# Patient Record
Sex: Female | Born: 1935 | Race: White | Hispanic: No | Smoking: Former smoker
Health system: Southern US, Community
[De-identification: ages and names within clinical notes are randomized; demographics above are authoritative.]

## PROBLEM LIST (undated history)

## (undated) DIAGNOSIS — I10 Essential (primary) hypertension: Secondary | ICD-10-CM

## (undated) DIAGNOSIS — C801 Malignant (primary) neoplasm, unspecified: Secondary | ICD-10-CM

## (undated) DIAGNOSIS — H269 Unspecified cataract: Secondary | ICD-10-CM

## (undated) DIAGNOSIS — R600 Localized edema: Secondary | ICD-10-CM

## (undated) DIAGNOSIS — E079 Disorder of thyroid, unspecified: Secondary | ICD-10-CM

## (undated) HISTORY — PX: ABDOMINAL HYSTERECTOMY: SHX81

## (undated) HISTORY — PX: APPENDECTOMY: SHX54

## (undated) HISTORY — PX: THROAT SURGERY: SHX803

## (undated) HISTORY — DX: Essential (primary) hypertension: I10

## (undated) HISTORY — PX: CHOLECYSTECTOMY: SHX55

## (undated) HISTORY — PX: CATARACT EXTRACTION: SUR2

## (undated) HISTORY — DX: Localized edema: R60.0

---

## 1997-06-04 ENCOUNTER — Other Ambulatory Visit: Admission: RE | Admit: 1997-06-04 | Discharge: 1997-06-04 | Payer: Self-pay | Admitting: Obstetrics and Gynecology

## 1998-06-25 ENCOUNTER — Other Ambulatory Visit: Admission: RE | Admit: 1998-06-25 | Discharge: 1998-06-25 | Payer: Self-pay | Admitting: Obstetrics and Gynecology

## 1999-06-26 ENCOUNTER — Other Ambulatory Visit: Admission: RE | Admit: 1999-06-26 | Discharge: 1999-06-26 | Payer: Self-pay | Admitting: Obstetrics and Gynecology

## 2001-09-20 ENCOUNTER — Encounter: Payer: Self-pay | Admitting: Otolaryngology

## 2001-09-20 ENCOUNTER — Ambulatory Visit (HOSPITAL_COMMUNITY): Admission: RE | Admit: 2001-09-20 | Discharge: 2001-09-20 | Payer: Self-pay | Admitting: Otolaryngology

## 2001-10-03 ENCOUNTER — Ambulatory Visit (HOSPITAL_BASED_OUTPATIENT_CLINIC_OR_DEPARTMENT_OTHER): Admission: RE | Admit: 2001-10-03 | Discharge: 2001-10-03 | Payer: Self-pay | Admitting: Otolaryngology

## 2001-10-03 ENCOUNTER — Encounter (INDEPENDENT_AMBULATORY_CARE_PROVIDER_SITE_OTHER): Payer: Self-pay | Admitting: Specialist

## 2001-10-20 ENCOUNTER — Ambulatory Visit (HOSPITAL_COMMUNITY): Admission: RE | Admit: 2001-10-20 | Discharge: 2001-10-20 | Payer: Self-pay | Admitting: Otolaryngology

## 2001-10-20 ENCOUNTER — Encounter (INDEPENDENT_AMBULATORY_CARE_PROVIDER_SITE_OTHER): Payer: Self-pay | Admitting: *Deleted

## 2002-06-04 ENCOUNTER — Encounter (INDEPENDENT_AMBULATORY_CARE_PROVIDER_SITE_OTHER): Payer: Self-pay | Admitting: *Deleted

## 2002-06-04 ENCOUNTER — Ambulatory Visit (HOSPITAL_BASED_OUTPATIENT_CLINIC_OR_DEPARTMENT_OTHER): Admission: RE | Admit: 2002-06-04 | Discharge: 2002-06-04 | Payer: Self-pay | Admitting: Otolaryngology

## 2003-07-25 ENCOUNTER — Ambulatory Visit (HOSPITAL_COMMUNITY): Admission: RE | Admit: 2003-07-25 | Discharge: 2003-07-25 | Payer: Self-pay | Admitting: Family Medicine

## 2003-08-22 ENCOUNTER — Ambulatory Visit (HOSPITAL_COMMUNITY): Admission: RE | Admit: 2003-08-22 | Discharge: 2003-08-22 | Payer: Self-pay | Admitting: Urology

## 2003-09-06 ENCOUNTER — Ambulatory Visit (HOSPITAL_COMMUNITY): Admission: RE | Admit: 2003-09-06 | Discharge: 2003-09-06 | Payer: Self-pay | Admitting: General Surgery

## 2003-09-11 ENCOUNTER — Observation Stay (HOSPITAL_COMMUNITY): Admission: RE | Admit: 2003-09-11 | Discharge: 2003-09-12 | Payer: Self-pay | Admitting: General Surgery

## 2003-12-02 ENCOUNTER — Ambulatory Visit (HOSPITAL_COMMUNITY): Admission: RE | Admit: 2003-12-02 | Discharge: 2003-12-02 | Payer: Self-pay | Admitting: Family Medicine

## 2004-05-07 ENCOUNTER — Ambulatory Visit (HOSPITAL_COMMUNITY): Admission: RE | Admit: 2004-05-07 | Discharge: 2004-05-07 | Payer: Self-pay | Admitting: Family Medicine

## 2004-06-05 ENCOUNTER — Ambulatory Visit: Payer: Self-pay | Admitting: Internal Medicine

## 2004-06-10 ENCOUNTER — Ambulatory Visit: Payer: Self-pay | Admitting: Internal Medicine

## 2004-06-10 ENCOUNTER — Ambulatory Visit (HOSPITAL_COMMUNITY): Admission: RE | Admit: 2004-06-10 | Discharge: 2004-06-10 | Payer: Self-pay | Admitting: Internal Medicine

## 2004-08-17 ENCOUNTER — Observation Stay (HOSPITAL_COMMUNITY): Admission: RE | Admit: 2004-08-17 | Discharge: 2004-08-18 | Payer: Self-pay | Admitting: Urology

## 2004-11-03 ENCOUNTER — Ambulatory Visit (HOSPITAL_COMMUNITY): Admission: RE | Admit: 2004-11-03 | Discharge: 2004-11-03 | Payer: Self-pay | Admitting: Family Medicine

## 2004-12-21 ENCOUNTER — Other Ambulatory Visit: Admission: RE | Admit: 2004-12-21 | Discharge: 2004-12-21 | Payer: Self-pay | Admitting: General Surgery

## 2005-02-02 ENCOUNTER — Ambulatory Visit: Payer: Self-pay | Admitting: Internal Medicine

## 2005-02-12 ENCOUNTER — Ambulatory Visit: Payer: Self-pay | Admitting: Internal Medicine

## 2005-02-12 ENCOUNTER — Ambulatory Visit (HOSPITAL_COMMUNITY): Admission: RE | Admit: 2005-02-12 | Discharge: 2005-02-12 | Payer: Self-pay | Admitting: Internal Medicine

## 2005-04-28 ENCOUNTER — Ambulatory Visit (HOSPITAL_COMMUNITY): Admission: RE | Admit: 2005-04-28 | Discharge: 2005-04-28 | Payer: Self-pay | Admitting: Urology

## 2005-09-07 ENCOUNTER — Ambulatory Visit (HOSPITAL_COMMUNITY): Admission: RE | Admit: 2005-09-07 | Discharge: 2005-09-07 | Payer: Self-pay | Admitting: Family Medicine

## 2005-11-16 ENCOUNTER — Ambulatory Visit (HOSPITAL_COMMUNITY): Admission: RE | Admit: 2005-11-16 | Discharge: 2005-11-16 | Payer: Self-pay | Admitting: Family Medicine

## 2006-03-28 ENCOUNTER — Ambulatory Visit (HOSPITAL_COMMUNITY): Admission: RE | Admit: 2006-03-28 | Discharge: 2006-03-28 | Payer: Self-pay | Admitting: Family Medicine

## 2006-05-23 ENCOUNTER — Ambulatory Visit (HOSPITAL_COMMUNITY): Admission: RE | Admit: 2006-05-23 | Discharge: 2006-05-23 | Payer: Self-pay | Admitting: Urology

## 2006-12-16 ENCOUNTER — Ambulatory Visit (HOSPITAL_COMMUNITY): Admission: RE | Admit: 2006-12-16 | Discharge: 2006-12-16 | Payer: Self-pay | Admitting: Family Medicine

## 2007-01-25 ENCOUNTER — Other Ambulatory Visit: Admission: RE | Admit: 2007-01-25 | Discharge: 2007-01-25 | Payer: Self-pay | Admitting: Obstetrics and Gynecology

## 2007-01-30 ENCOUNTER — Ambulatory Visit (HOSPITAL_COMMUNITY): Admission: RE | Admit: 2007-01-30 | Discharge: 2007-01-30 | Payer: Self-pay | Admitting: Obstetrics & Gynecology

## 2007-05-17 ENCOUNTER — Ambulatory Visit (HOSPITAL_COMMUNITY): Admission: RE | Admit: 2007-05-17 | Discharge: 2007-05-17 | Payer: Self-pay | Admitting: Family Medicine

## 2007-05-17 ENCOUNTER — Encounter: Payer: Self-pay | Admitting: Orthopedic Surgery

## 2007-06-01 ENCOUNTER — Ambulatory Visit (HOSPITAL_COMMUNITY): Admission: RE | Admit: 2007-06-01 | Discharge: 2007-06-01 | Payer: Self-pay | Admitting: Family Medicine

## 2007-06-01 ENCOUNTER — Encounter: Payer: Self-pay | Admitting: Orthopedic Surgery

## 2008-08-20 ENCOUNTER — Ambulatory Visit (HOSPITAL_COMMUNITY): Admission: RE | Admit: 2008-08-20 | Discharge: 2008-08-20 | Payer: Self-pay | Admitting: Family Medicine

## 2008-10-17 ENCOUNTER — Ambulatory Visit: Payer: Self-pay | Admitting: Orthopedic Surgery

## 2008-10-17 DIAGNOSIS — M479 Spondylosis, unspecified: Secondary | ICD-10-CM | POA: Insufficient documentation

## 2008-10-17 DIAGNOSIS — M5137 Other intervertebral disc degeneration, lumbosacral region: Secondary | ICD-10-CM | POA: Insufficient documentation

## 2008-10-17 DIAGNOSIS — M76899 Other specified enthesopathies of unspecified lower limb, excluding foot: Secondary | ICD-10-CM

## 2008-10-24 ENCOUNTER — Encounter (HOSPITAL_COMMUNITY): Admission: RE | Admit: 2008-10-24 | Discharge: 2008-11-23 | Payer: Self-pay | Admitting: Orthopedic Surgery

## 2008-11-01 ENCOUNTER — Encounter: Payer: Self-pay | Admitting: Orthopedic Surgery

## 2009-04-01 ENCOUNTER — Ambulatory Visit (HOSPITAL_COMMUNITY): Admission: RE | Admit: 2009-04-01 | Discharge: 2009-04-01 | Payer: Self-pay | Admitting: Obstetrics and Gynecology

## 2009-07-17 ENCOUNTER — Ambulatory Visit (HOSPITAL_COMMUNITY): Admission: RE | Admit: 2009-07-17 | Discharge: 2009-07-17 | Payer: Self-pay | Admitting: Family Medicine

## 2009-12-05 DIAGNOSIS — Z8719 Personal history of other diseases of the digestive system: Secondary | ICD-10-CM

## 2009-12-09 ENCOUNTER — Encounter: Payer: Self-pay | Admitting: Internal Medicine

## 2009-12-09 ENCOUNTER — Ambulatory Visit: Payer: Self-pay | Admitting: Gastroenterology

## 2009-12-09 DIAGNOSIS — R1319 Other dysphagia: Secondary | ICD-10-CM

## 2009-12-30 ENCOUNTER — Ambulatory Visit (HOSPITAL_COMMUNITY): Admission: RE | Admit: 2009-12-30 | Discharge: 2009-12-30 | Payer: Self-pay | Admitting: Internal Medicine

## 2009-12-30 ENCOUNTER — Ambulatory Visit: Payer: Self-pay | Admitting: Internal Medicine

## 2010-01-04 ENCOUNTER — Encounter: Payer: Self-pay | Admitting: Internal Medicine

## 2010-03-18 ENCOUNTER — Encounter (INDEPENDENT_AMBULATORY_CARE_PROVIDER_SITE_OTHER): Payer: Self-pay | Admitting: *Deleted

## 2010-03-18 ENCOUNTER — Ambulatory Visit (HOSPITAL_COMMUNITY)
Admission: RE | Admit: 2010-03-18 | Discharge: 2010-03-18 | Payer: Self-pay | Source: Home / Self Care | Attending: Obstetrics & Gynecology | Admitting: Obstetrics & Gynecology

## 2010-04-09 NOTE — Assessment & Plan Note (Signed)
Summary: DIFFICULTY SWALLOWING FOODS/SS   Visit Type:  Initial Consult Primary Care Provider:  Dr. Regino Schultze  Chief Complaint:  difficulty getting food to go down.  History of Present Illness: 76 y/o caucasian female  with dysphagia on one episode w/ corn bread about 3 weeks ago.  Denies any further problems with dysphagia, including liquids and solids.  Occ TUMS w/ spicy foods 1-2 per week.  Denies regurg or anorexia.  Wt stable.  Denies rectaql bleeding or melena.  Hx hemorrhoids and some constipation. Takes stool softeners as needed.   Current Problems (verified): 1)  Gastroesophageal Reflux Disease, Hx of  (ICD-V12.79) 2)  Degenerative Disc Disease, Lumbar Spine  (ICD-722.52) 3)  Spondylosis  (ICD-721.90) 4)  Bursitis, Hip  (ICD-726.5)  Current Medications (verified): 1)  Levothroid 50 Mcg Tabs (Levothyroxine Sodium) 2)  Vit C .... Once Daily 3)  Vit.d .... Once Daily 4)  Vit A .... Once Daily 5)  Vit D .... Once Daily 6)  Xanax 0.5 Mg Tabs (Alprazolam) .... As Needed At Bedtime  Allergies (verified): 1)  ! Pcn  Past History:  Past Medical History: GERD last EGD 06/2004 Dr Pauline Good erosions Last colonscopy 02/2005 Dr Ronni Rumble AVM non-bleeding cecum, internal hemorrhoids throat CA (Dr Pollyann Kennedy) arthritis DEGENERATIVE DISC DISEASE, LUMBAR SPINE (ICD-722.52) SPONDYLOSIS (ICD-721.90) BURSITIS, HIP (ICD-726.5)  Past Surgical History: gallbladder 2005 appendix  hysterectomy Bladder surgery throat CA s/p ENT resection  Family History:  Hx, family, asthma Father: (deceased 59) bladder CA Mother: (deceased 38) CHF Siblings: 3 living, 1 brother CA lung-deceased, MI CAD 1 sister deceased CHF, cardiomyopathy  Social History: Patient is married x 10yrs  homemaker 3 children-healthy quit smoking couple weeks ago, 60 PKYR hx Alcohol Use - couple/month Illicit Drug Use - no Patient gets regular exercise. Drug Use:  no Does Patient Exercise:  yes  Vital  Signs:  Patient profile:   76 year old female Height:      68 inches Weight:      183.5 pounds BMI:     28.00 Temp:     97.8 degrees F oral Pulse rate:   68 / minute BP sitting:   130 / 88  (left arm) Cuff size:   regular  Vitals Entered By: Hendricks Limes LPN (December 09, 2009 10:57 AM)  Physical Exam  General:  Well developed, well nourished, no acute distress. Head:  Normocephalic and atraumatic. Eyes:  Sclera clear, no icterus. Ears:  Normal auditory acuity. Nose:  No deformity, discharge,  or lesions. Mouth:  No deformity or lesions.  Neck:  Supple; no masses or thyromegaly. Lungs:  Clear throughout to auscultation. Heart:  Regular rate and rhythm; no murmurs, rubs,  or bruits. Abdomen:  Soft, nontender and nondistended. No masses, hepatosplenomegaly or hernias noted. Normal bowel sounds.without guarding and without rebound.   Msk:  Symmetrical with no gross deformities. Normal posture. Pulses:  Normal pulses noted. Extremities:  No clubbing, cyanosis, edema or deformities noted. Neurologic:  Alert and  oriented x4;  grossly normal neurologically. Skin:  Intact without significant lesions or rashes. Cervical Nodes:  No significant cervical adenopathy. Psych:  Alert and cooperative. Normal mood and affect.  Impression & Recommendations:  Problem # 1:  OTHER DYSPHAGIA (ZOX-096.04) 76 y/o caucasian female w/ esophageal dysphagia w/ solid food.  I suspect esophageal, web, ring or stricture, but we need to r/o mass.  EGD with possible esophageal dilation to be performed by Dr. Jonathon Bellows in the near future.  I have discussed risks  and benefits which include, but are not limited to, bleeding, infection, perforation, or medication reaction.  The patient agrees with this plan and consent will be obtained.  Orders: Consultation Level III (82956)  Problem # 2:  GASTROESOPHAGEAL REFLUX DISEASE, HX OF (ICD-V12.79) Will add PPI  Orders: Consultation Level III  (21308)  Patient Instructions: 1)  Begin Nexium 40 mg daily for acid reflux #4 boxes samples given 2)  GERD precautions 3)  CC:  Cyril Mourning, NP

## 2010-04-09 NOTE — Letter (Signed)
Summary: EGD/ED ORDER  EGD/ED ORDER   Imported By: Ave Filter 12/09/2009 11:40:00  _____________________________________________________________________  External Attachment:    Type:   Image     Comment:   External Document

## 2010-04-09 NOTE — Letter (Signed)
Summary: Recall Office Visit  [facility]  [address]   [place], [place] [address]   Phone: [phone]  Fax: [phone]      March 18, 2010   [facility] [place] [address] [place], [place]  [address] 03/24/1934   Dear Ms. Butzer,   According to our records, it is time for you to schedule a follow-up office visit with [place].   At your convenience, please call [phone] to schedule an office visit. If you have any questions, concerns, or feel that this letter is in error, we would appreciate your call.   Sincerely,    Diana Eves  [facility] Ph: [phone]   Fax: [phone]

## 2010-04-09 NOTE — Letter (Signed)
Summary: Patient Notice, Endo Biopsy Results  [facility]  [address]   [place], [place] [address]   Phone: [phone]  Fax: [phone]       January 04, 2010   [facility] [place] [address] [place], [place]  [address] 10-17-1934    Dear Ms. Dolle,  I am pleased to inform you that the biopsies taken during your recent endoscopic examination did not show any evidence of cancer upon pathologic examination.  There was only mild inflammation.  Additional information/recommendations:  Please call [phone] to schedule a return visit to review your condition.  Continue with the treatment plan as outlined on the day of your exam.  Please call us if you are having persistent problems or have questions about your condition that have not been fully answered at this time.  Sincerely,    R. Roetta Sessions MD, FACP [facility] Ph: [phone]   Fax: [phone]   Appended Document: Patient Notice, Endo Biopsy Results letter mailed to pt  Appended Document: Patient Notice, Endo Biopsy Results reminder in computer

## 2010-04-09 NOTE — Miscellaneous (Signed)
Summary: PT order  PT order   Imported By: Cammie Sickle 06/30/2009 18:05:23  _____________________________________________________________________  External Attachment:    Type:   Image     Comment:   External Document

## 2010-04-11 ENCOUNTER — Encounter: Payer: Self-pay | Admitting: Obstetrics & Gynecology

## 2010-07-24 NOTE — Consult Note (Signed)
[name], Williamson                ACCOUNT NO.:  [id number]   MEDICAL RECORD NO.:  [id number]          PATIENT TYPE:  AMB   LOCATION:                                 FACILITY:   PHYSICIAN:  R. Roetta Sessions, M.D. DATE OF BIRTH:  08/23/1934   DATE OF CONSULTATION:  06/05/2004  DATE OF DISCHARGE:                                   CONSULTATION   REQUESTING PHYSICIAN:  Patrica Duel, M.D.   REASON FOR CONSULTATION:  Abdominal pain.   HISTORY OF PRESENT ILLNESS:  Ms. Manz is a 76 year old lady who presents  today for further evaluation of abdominal pain at the request of Dr. Patrica Duel. She say Dr. Nobie Putnam about three to four weeks ago with severe  epigastric pain and pain in right shoulder blade. She denies any associated  nausea or vomiting. She states she was quite tender on examination. She had  an abdominal ultrasound which revealed fatty liver and pancreas but no acute  abnormality. Appointment was made with [place] for further evaluation. She states  the pain in her back persists. It is constant. It seems to be worse with  movement. She does a lot of lifting, doing greenhouse work. She also  continues to have pain in the epigastrium, but this pain seems to be more  related to meals. She ate cabbage and pinto beans and sausage yesterday and  had severe pain. She does have pain intermittently related to most meals.  Denies any typical heartburn symptoms. This pain she actually has had off  and on since her gallbladder out about a year ago. She had a large solitary  2.1 cm non obstructing gallstone based on ultrasound. She denies any nausea  and vomiting. Her bowels are regular. Denies melena or rectal bleeding.  Denies dysphagia, odynophagia, or heartburn. She does take aspirin products  fairly regularly for headaches and arthritis. She takes Excedrin or Goody's  Powders. Denies any weight loss. She complains of easy bruising.   MEDICATIONS:  1.  Premarin 0.625 mg q.d.  2.   Xanax 0.5 mg q.h.s.  3.  Goody's Powder p.r.n., usually several days a week.  4.  Vitamin E p.r.n.  5.  Vitamin C p.r.n.  6.  Excedrin p.r.n., usually several days a week.   ALLERGIES:  PENICILLIN.   PAST MEDICAL HISTORY:  History of throat cancer status post resection about  three years ago. She did not have any chemotherapy or radiation therapy. She  also has headaches and arthritis.   PAST SURGICAL HISTORY:  Cholecystectomy about a year ago as outlined above.  She has had an appendectomy and resection of throat cancer tumor.   FAMILY HISTORY:  Mother died of heart failure age 95. Father died of bladder  cancer, age 63. She had a brother with MI and one with COPD. No family  history of colorectal cancer.   SOCIAL HISTORY:  She is married and has three children. She runs a  greenhouse. She smokes a pack of cigarettes daily and has smoked all of her  life. Denies any alcohol use.   REVIEW  OF SYSTEMS:  See HPI for GI. CONSTITUTIONAL:  Denies any weight loss  or fatigue. CARDIOPULMONARY:  Denies any shortness of breath or cough or  chest pain. GENITOURINARY:  Complains of urinary incontinence, followed by  Dr. Rito Ehrlich.   PHYSICAL EXAMINATION:  VITAL SIGNS:  Weight 180, temperature 97.8, blood  pressure 130/80, pulse 80.  GENERAL:  Pleasant, well-developed, well-nourished, elderly, Caucasian  female in no acute distress.  SKIN:  Warm and dry. No jaundice.  HEENT:  Pupils are equal, round, and reactive to light. Conjunctivae are  pink. Sclerae are nonicteric. Oropharyngeal mucosa moist and pink. No  lesions, erythema or exudate. No lymphadenopathy or thyromegaly.  CHEST:  Lungs are clear to auscultation. Cardiac exam reveals regular rate  and rhythm. Normal S1 and S2. No murmurs, rubs, or gallops.  ABDOMEN:  Positive bowel sounds. Soft, nondistended. She has mild epigastric  tenderness to deep palpation. No organomegaly or masses appreciated. No  rebound tenderness or guarding.  No abdominal bruits. No CVA tenderness.  Palpation along the thoracic and lumbar spine is unremarkable. There is no  rash seen.  EXTREMITIES:  No edema.   STUDIES:  She had a CT without contrast in June _________ which revealed a  gallstone but otherwise unremarkable. She had labs in January 2006 revealing  normal CBC, MET7, LFTs, TSH.   IMPRESSION:  Ms. Linda Williamson is a 76 year old lady who presents with chronic  epigastric pain with exacerbation over the last several weeks. She also has  pain in the right shoulder blade region which I feel is unrelated, and that  pain is most likely musculoskeletal in origin. With regards to her  epigastric pain, it tends to be worse with meals. She does take regular  aspirin products and is at risk for peptic ulcer disease or gastritis. We  discussed today possibility of upper GI series versus EGD, and she is  interested in upper endoscopy. She has a history of throat cancer but does  not appear to have any problems suggestive of recurrence. She complains of  easy bruising which most likely is due to her aspirin consumption; however,  will check coags. She has never had a colonoscopy, and I discussed with her  today the need for colorectal cancer screening; however, she would like to  postpone for now.   PLAN:  1.  EGD in the near future.  2.  Aciphex 20 mg daily, #4 boxes of samples.  3.  PT, PTT, LFTs and lipase.  4.  Recommend colonoscopy in some point in the near future. The patient      would like to postpone for now.  5.  I would like to thank Dr. Patrica Duel for allowing [place] to take part in      the care of this patient.      LL/MEDQ  D:  06/05/2004  T:  06/05/2004  Job:  [id number]   cc:   Patrica Duel, M.D.  [address]  [place]  [place] [address]  Fax: [phone]

## 2010-07-24 NOTE — Op Note (Signed)
Linda Williamson, ROG                ACCOUNT NO.:  [id number]   MEDICAL RECORD NO.:  [id number]          PATIENT TYPE:  OBV   LOCATION:  A336                          FACILITY:  [facility]   PHYSICIAN:  Dennie Maizes, M.D.   DATE OF BIRTH:  06/15/1934   DATE OF PROCEDURE:  08/17/2004  DATE OF DISCHARGE:  08/18/2004                                 OPERATIVE REPORT   PREOPERATIVE DIAGNOSIS:  Stress urinary incontinence.   POSTOPERATIVE DIAGNOSIS:  Stress urinary incontinence.   OPERATIVE PROCEDURE:  Transvaginal tension free suburethral sling tape  procedure.[facility] T sling).   ANESTHESIA:  General.   SURGEON:  Dennie Maizes, M.D.   COMPLICATIONS:  Trocar entry into the bladder on the right side.   ESTIMATED BLOOD LOSS:  Minimal.   DRAINS:  16-French Foley catheter in the bladder.   INDICATIONS FOR PROCEDURE:  This 76 year old female with  troublesome stress  urinary incontinence did not respond to conservative treatment. She was  taken to the OR today for a transvaginal tape suburethral sling procedure.   DESCRIPTION OF PROCEDURE:  General anesthesia was induced and the patient  was placed on the OR table in the dorsolithotomy position. The lower abdomen  and genitalia were prepped and draped in a sterile fashion. A 16-French  Foley catheter was inserted into the bladder and clear urine was drained.   The pubic tubercles were marked on the skin. A point about 1.5 cm above and  lateral to the pubic tubercles were marked on both sites in the suprapubic  area.  About 10 cc of normal saline was injected paravesically on both sides  for hydrodissection. The midureter was then held with Allis forceps. About 5  cc of sterile saline was injected submucosally in the periureteral area. The  mid ureteral incision about 1 cm in length was then made. The submucosal  planes were created by sharp and blunt dissection. The trocar carrying the T  sling was then inserted on the right side with  digital guidance. The trocar  was inserted behind the pubic ramus to exit through the previously marked  point on the skin. The bladder neck was pushed to the opposite side during  the trocar insertion. Cystoscopy revealed a small area of trocar entry into  the bladder. The trocar was then reinserted in a slightly lteral  position.Cystoscopy confirmed a satisfactory trocar placement. A similar  procedure was done on the left side with the trocar. Cystoscopy revealed  good position of the trocar outside the bladder. The suture carrying the  Prolene mesh was then taken out of the trocar. The sutures were pulled  through the suprapubic incision bringing the Prolene mesh through the  suprapubic incisions. Then a 16-French Foley catheter was inserted into the  bladder and clear urine was drained. The tension of the Prolene mesh was  then adjusted. After this a pair of Metzenbaum scissors could be placed  between the ureter and the sling without any difficulty. Plastic sheaths  covering the sling were then removed on both sides. The ureteral incision  was then closed using 3-0 Vicryl. The  redundant mesh was excised at the  level of the subcutaneous tissues. The suprapubic incisions were then closed  using 4-0 Vicryl subcuticular stitches. Abdominal dressing was applied. The  Foley catheter was connected to the bag.  The vagina was packed with  Betadine-soaked gauze. The estimated blood loss was minimal. Sponges and  instruments were correct x 2 at the time of closure. The patient was  transferred to the PACU in a satisfactory condition.       SK/MEDQ  D:  08/17/2004  T:  08/17/2004  Job:  [id number]   cc:   Patrica Duel, M.D.  [address]  [place]  [place] [address]  Fax: [phone]

## 2010-07-24 NOTE — H&P (Signed)
NAMEHAVILAH, TOPOR                ACCOUNT NO.:  [id number]   MEDICAL RECORD NO.:  [id number]          PATIENT TYPE:  AMB   LOCATION:  DAY                           FACILITY:  [facility]   PHYSICIAN:  R. Roetta Sessions, M.D. DATE OF BIRTH:  04/18/1934   DATE OF ADMISSION:  DATE OF DISCHARGE:  LH                                HISTORY & PHYSICAL   PRIMARY CARE PHYSICIAN:  Patrica Duel, M.D.   CHIEF COMPLAINT:  Intermittent diarrhea.   HISTORY OF PRESENT ILLNESS:  Mrs. Napoleon is a 76 year old Caucasian female  who is referred at the courtesy of Dr. Malvin Johns for a change of bowel  habits.  She has been seen by him for some thrombosed hemorrhoids.  He noted  over the last several months she has had alternating diarrheal stools about  twice a day.  She has also had some small volume rectal bleeding which she  contributes to her hemorrhoids.  She denies any melena.  She notes her  diarrhea is certainly worse after certain foods like greens and onions.  She  denies any associated abdominal pain. She does report some abdominal  bloating.  She also notes easy bruising as she takes Excedrin Tension  Headaches two about 2-3 times a week.  She denies any heartburn,  indigestion, dysphagia or odynophagia, nausea or vomiting.  She denies any  recent antibiotic use, denies any foreign travel.  She did have EGD by Dr.  Jena Gauss June 10, 2004, for some epigastric pain.  She had a normal esophagus  and some antral erosions.  She was H.pylori negative.  She has never had a  screening colonoscopy.   PAST MEDICAL HISTORY:  1.  Bladder surgery for carcinoma status post resection, followed by Dr.      Pollyann Kennedy.  2.  GERD with last EGD April 2006 by Dr. Jena Gauss with antral erosions.  3.  Cholecystectomy 2005.  4.  Appendectomy.  5.  Chronic tension headaches.  6.  Arthritis.   CURRENT MEDICATIONS:  1.  Premarin 0.625 mg daily.  2.  Xanax 0.5 mg q.h.s.  3.  Vitamin E daily.  4.  Vitamin C daily.  5.  Excedrin  Tension Headache p.r.n.  6.  Fish oil daily.   ALLERGIES:  Penicillin.   FAMILY HISTORY:  Mother deceased secondary to congestive heart failure age  79, father deceased secondary to bladder cancer at age 46.  She had one  brother with CAD and MI and one with COPD.  No family history of colorectal  carcinoma, liver or chronic GI problems.   SOCIAL HISTORY:  Mrs. Urieta is married.  She has three children.  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  She smokes a pack of cigarettes a day, most of  her life.  Denies any alcohol or drug use.   REVIEW OF SYSTEMS:  CONSTITUTIONAL:  Weight stable.  She is complaining of  some significant insomnia and waking up several times throughout the night.  Denies any fever or chills.  CARDIOVASCULAR:  Denies any chest pain or  palpitations. PULMONOLOGY:  She does  have a chronic productive cough with  white sputum and long standing tobacco use history.  PSYCHOSOCIAL:  She is  complaining of her husband being sick with renal problems.  She has been  having to take care of him.  She is under a significant amount of stress at  this time.  NEUROLOGICAL:  She complains of chronic tension headaches which  she is being followed by her primary care physician for this.  GI:  See HPI.   PHYSICAL EXAMINATION:  VITAL SIGNS:  Weight 172 pounds, height 69 inches,  temperature 97.9, blood pressure 126/80, pulse 88.  GENERAL APPEARANCE:  Mrs Zaugg is a 76 year old Caucasian female who is  alert, oriented, pleasant, cooperative, no acute distress.  HEENT:  Sclerae are clear, nonicteric.  Conjunctivae are pink.  Oropharynx  pink and moist without any lesions.  NECK:  Supple without any mass or thyromegaly.  CHEST:  Heart rate regular rate and rhythm with normal S1, S2.  without any  murmurs, clicks, rubs or gallops.  LUNGS:  Decreased breath sounds bilaterally with emphysematous changes.  She  does have expiratory wheezes throughout as well.  No acute distress.  ABDOMEN:   Positive bowel sounds x4.  No bruits auscultated.  Soft,  nontender, nondistended with no palpable mass or hepatosplenomegaly.  No  rebound tenderness or guarding.  EXTREMITIES:  No edema or clubbing bilaterally.  She does have multiple  petechiae to her upper forearm as well as some ecchymotic areas of her upper  and lower extremities.   IMPRESSION:  Mrs. Calais is a 76 year old Caucasian female with intermittent  episodes of diarrhea.  I suspect most of her symptoms could be related to  irritable bowel syndrome.  She is under a significant amount of stress at  this time.  She does notice symptoms are worse with certain foods, onions  and greens as well as dairy products.  She could have an element of lactose  intolerance.  She has noted some rectal bleeding as well.  Suspect this is  due to her history of thrombosed hemorrhoids.  However, she has never had a  colonoscopy, and this needs to be done to rule out colorectal carcinoma as  well as inflammatory bowel disease, which is less likely.   PLAN:  Will schedule colonoscopy with Dr. Jena Gauss in the near future.  I have  discussed procedure including risks and benefits including but not limited  to bleeding, infection, perforation, drug reaction.  She agrees  with plan  and consent will be obtained.  She is to hold her aspirin for three days  prior to the procedure.  Would like to thank Dr. Malvin Johns for allowing [place] to  participate in the care of Mrs. Rolph.      Nicholas Lose, N.P.      Jonathon Bellows, M.D.  Electronically Signed    KC/MEDQ  D:  02/02/2005  T:  02/02/2005  Job:  [id number]   cc:   Patrica Duel, M.D.  Fax: [phone]

## 2010-07-24 NOTE — Op Note (Signed)
NAMEALIVEA, GLADSON                ACCOUNT NO.:  [id number]   MEDICAL RECORD NO.:  [id number]          PATIENT TYPE:  AMB   LOCATION:  DAY                           FACILITY:  [facility]   PHYSICIAN:  R. Roetta Sessions, M.D. DATE OF BIRTH:  November 19, 1934   DATE OF PROCEDURE:  02/12/2005  DATE OF DISCHARGE:                                 OPERATIVE REPORT   PROCEDURE:  Colonoscopy with ileoscopy.   INDICATIONS FOR PROCEDURE:  A 76 year old lady with intermittent diarrhea,  some low volume painless rectal bleeding as well. Colonoscopy is now being  done. This approach has been discussed with the patient at length. Potential  risks, benefits, and alternatives have been reviewed and questions answered.  He is agreeable. Please see documentation in the medical record.   PROCEDURE NOTE:  O2 saturation, blood pressure, pulse, and respirations were  monitored throughout the entire procedure. Conscious sedation with Versed 5  mg IV and Demerol 100 mg IV in divided doses.   FINDINGS:  Digital rectal exam revealed no abnormalities.   ENDOSCOPIC FINDINGS:  Prep was adequate.   Rectum:  Examination of the rectal mucosa including retroflexed view of the  anal verge revealed only internal hemorrhoids, a couple of anal papilla.   Colon:  Colonic mucosa was surveyed from the rectosigmoid junction through  the left, transverse, and right colon to the area of the appendiceal  orifice, ileocecal valve, and cecum. These structures were well seen and  photographed for the record. Terminal ileum was intubated to 10 cm. From  this level, the scope was slowly withdrawn, and all previously mentioned  mucosal surfaces were again seen. The colonic mucosa appeared entirely  normal except for a small AVM at the cecum not felt to be clinically  significant. Terminal ileal mucosa appeared normal. The patient tolerated  the procedure well and was reactive to endoscopy.   IMPRESSION:  Internal hemorrhoids, anal papilla.  Otherwise normal rectum.  Small arteriovenous malformation, nonbleeding, cecum, not manipulated.  Otherwise normal colonic mucosa. Normal terminal ileum. I suspect the  patient has benign intermittent anorectal bleeding and likely irritable  bowel syndrome.   RECOMMENDATIONS:  1.  Hemorrhoid literature provided to Ms. Raysor. Ten-day course of Anusol      suppository one per rectum at      bedtime.  2.  Daily fiber supplementation in the way of Metamucil or Citrucel.  3.  Followup appointment with [place] in three months.      Jonathon Bellows, M.D.  Electronically Signed     RMR/MEDQ  D:  02/12/2005  T:  02/12/2005  Job:  [id number]

## 2010-07-24 NOTE — Op Note (Signed)
Linda Williamson, Linda Williamson                ACCOUNT NO.:  [id number]   MEDICAL RECORD NO.:  [id number]          PATIENT TYPE:  AMB   LOCATION:  DAY                           FACILITY:  [facility]   PHYSICIAN:  R. Roetta Sessions, M.D. DATE OF BIRTH:  February 24, 1935   DATE OF PROCEDURE:  06/10/2004  DATE OF DISCHARGE:                                 OPERATIVE REPORT   INDICATIONS FOR PROCEDURE:  A 76 year old lady with epigastric pain.  A  multitude of laboratory studies back in January of 2006 came back negative  (please see consultation note).  Prior ultrasound demonstrated fatty liver  and pancreas, but no other abnormalities.  She was started on Aciphex 20 mg  orally daily in our office on June 05, 2004.  She took a couple of pills,  said she felt much better and then stopped taking the Aciphex.  She does  take Goody's Powders and Excedrin occasionally.  EGD is now being down.  This approach has been discussed with the patient at length.  The potential  risks, benefits and alternatives have been reviewed and questions answered.  Please see the documentation in the medical record.   PROCEDURE NOTE:  O2 saturation, blood pressure, pulse and respirations were  monitored throughout the entire procedure.  Conscious sedation with Versed 4  mg IV and Demerol 100 mg IV in divided doses.   INSTRUMENT:  Olympus video chip system.   FINDINGS:  Examination of the tubular esophagus revealed no mucosal  abnormality.  The EG junction was easily traversed.   Stomach:  The gastric cavity was emptied and insufflated well with air.  Thorough examination of the gastric mucosa, including retroflexed view of  the proximal stomach and esophagogastric junction, demonstrated some  prepyloric antral erosions.  Otherwise the gastric mucosa appeared normal.  The pylorus was patent and easily traversed.  Examination of the bulb and  second portion revealed no abnormalities.   THERAPEUTIC/DIAGNOSTIC MANEUVERS PERFORMED:   None.   The patient tolerated the procedure well and reacted at endoscopy.   IMPRESSION:  1.  Normal esophagus.  2.  Antral erosions, otherwise normal stomach and normal D1 and D2.   RECOMMENDATIONS:  1.  Helicobacter pylori serologies today.  2.  Resume Aciphex 20 mg orally daily.  3.  Office appointment with [place] in three months.  4.  She needs a screening colonoscopy.  She has declined it previously.      Will address this again with her when she returns in three months.      RMR/MEDQ  D:  06/10/2004  T:  06/10/2004  Job:  [id number]   cc:   Patrica Duel, M.D.  [address]  [place]  [place] [address]  Fax: [phone]

## 2010-07-24 NOTE — Op Note (Signed)
Linda Williamson, Linda Williamson                          ACCOUNT NO.:  [id number]   MEDICAL RECORD NO.:  [id number]                   PATIENT TYPE:  OIB   LOCATION:  NA                                   FACILITY:  [facility]   PHYSICIAN:  Jefry H. Pollyann Kennedy, M.D.                DATE OF BIRTH:  Jul 19, 1934   DATE OF PROCEDURE:  09/24/2001  DATE OF DISCHARGE:                                 OPERATIVE REPORT   PREOPERATIVE DIAGNOSIS:  Laryngeal mass.   POSTOPERATIVE DIAGNOSIS:  Laryngeal mass.   PROCEDURE:  1. Microlaryngoscopy with biopsy.  2. Esophagoscopy.   SURGEON:  Jefry H. Pollyann Kennedy, M.D.   ANESTHESIA:  General endotracheal anesthesia.   COMPLICATIONS:  None.   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  There was brachy space edema and vocal polyp on  the right vocal cord with a small nodular area of leukoplakia in the mid  portion.  The esophagus was normal.  The patient tolerated the procedure  well, was awakened, extubated, and transferred to recovery in stable  condition.   HISTORY:  A 76 year old lady with history of smoking and a several month  history of hoarseness.  Office examination revealed a laryngeal mass on the  left side and potential polyp on the right.  Risks, benefits, alternatives,  and complications of the procedure were explained to the patient.  She  seemed to understand and agreed to surgery.   DESCRIPTION OF PROCEDURE:  The patient was taken to the operating room and  placed on the operating table in a supine position.  Following induction of  general endotracheal anesthesia the table was turned 90 degrees.  The  patient was draped in a standard fashion.  A maxillary tooth guard was  used.   1. Esophagoscopy.  A rigid cervical esophagoscope was entered into the oral     cavity through the cricopharyngeus and down to the hub of the scope.  It     was slowly withdrawn using suction and rotation to examine     circumferentially the wall of the cervical esophagus.  There were no     lesions identified.  2. Microlaryngoscopy with biopsy.  A Dedo laryngoscope was entered into the     oral cavity, used to visualize the laryngeal and hypopharyngeal     structures.  The hypopharynx and the  remainder of the larynx were     unremarkable except for the above-mentioned findings.  The suspension     apparatus was connected to the laryngoscope and the microscope was     brought into view.  Careful inspection of the endolarynx revealed the  above-mentioned findings.  Biopsies were taken of the posterior aspect     which was the deepest aspect of the     neoplasm.  This was sent for pathologic evaluation using permanent     staining.  Topical adrenaline was applied to provide hemostasis of the     biopsy site.  No further biopsies were taken.  The laryngoscope was     removed, as was the tooth guard.  The patient was awakened, extubated,     and transferred to recovery.                                               Jefry H. Pollyann Kennedy, M.D.    JHR/MEDQ  D:  10/03/2001  T:  10/05/2001  Job:  [id number]   cc:   Jonell Cluck, M.D.

## 2010-07-24 NOTE — H&P (Signed)
NAMESHERIA, ROSELLO                ACCOUNT NO.:  [id number]   MEDICAL RECORD NO.:  [id number]          PATIENT TYPE:  OBV   LOCATION:  A336                          FACILITY:  [facility]   PHYSICIAN:  Dennie Maizes, M.D.   DATE OF BIRTH:  [birth year]   DATE OF ADMISSION:  08/17/2004  DATE OF DISCHARGE:  06/13/2006LH                                HISTORY & PHYSICAL   CHIEF COMPLAINT:  Urinary leakage.   HISTORY OF PRESENT ILLNESS:  This 76 year old female was referred to me by  Dr. Nobie Putnam for evaluation and management of her urinary leakage about a  year ago.  The patient had mixed type of urinary incontinence.  She had  urinary urgency, urge incontinence, as well as stress urinary incontinence  during coughing and sneezing.  She did not have any voiding difficulty,  dysuria, hematuria, flank pain, fever, chills, or urolithiasis.  She had  urinary frequency x5 and nocturia x1-2.  Stress urinary incontinence is much  more troublesome than the urge incontinence.  After evaluation, she was  treated conservatively.  She was treated with anticholinergic medications,  as well as pelvic floor muscle exercises.  She had good improvement of the  urinary urgency and urge incontinence.  She has troublesome persistent  stress urinary incontinence, and she was brought to the short stay center  today for transvaginal tape, urethral sling procedure for correction of  stress urinary incontinence.   PAST MEDICAL HISTORY:  History of depression and anxiety, status post  partial hysterectomy, status post appendectomy, status post cholecystectomy.   MEDICATIONS:  1.  Premarin 0.625 mg one p.o. daily.  2.  Vitamin D.  3.  Vitamin C.   ALLERGIES:  PENICILLIN.   FAMILY HISTORY:  Positive for carcinoma of the bladder, heart disease, and  heart failure.   PHYSICAL EXAMINATION:  HEENT:  Normal.  NECK:  No masses.  LUNGS:  Clear to auscultation.  HEART:  Regular rate and rhythm.  No murmurs.  ABDOMEN:   Soft.  No palpable flank mass.  No CVA tenderness.  Bladder not  palpable.  PELVIC:  Status post hysterectomy.  No evidence of pelvic relaxation.   The patient has undergone evaluation in the office.  Catheterization  revealed a postvoid residual of 20 cc.  The patient had normal bladder  sensations, and could feel the filling of the bladder to a volume of 26 cc.  She developed the desire void at a volume of 292 cc, and her bladder  capacity was 380 cc.  There were minimal  uninhibited bladder contractions.  Her leak point pressure was more that 100 cm of water, and the patient had  stress urinary incontinence in the supine, as well as upright positions.  There was no evidence of pelvic relaxation.   IMPRESSION:  Stress urinary incontinence.   PLAN:  I have discussed with the patient regarding the diagnosis, the  operative details, the alternate treatments, and she has decided to undergo  transvaginal tape and urethral sling procedure.  I have informed her  regarding the operative details, possible risks and complications,  postoperative  urinary retention, and postoperative urinary incontinence have  been explained to the patient.  I have also explained to her regarding the  complications like infection, bleeding, injury to the bladder, ureters,  intestines, and blood vessels.  Her questions have been answered, and she  has agreed for the procedure to be done.       SK/MEDQ  D:  08/16/2004  T:  08/16/2004  Job:  [id number]

## 2010-07-24 NOTE — Op Note (Signed)
Linda Williamson, Linda Williamson                ACCOUNT NO.:  [id number]   MEDICAL RECORD NO.:  [id number]          PATIENT TYPE:  AMB   LOCATION:  DAY                           FACILITY:  [facility]   PHYSICIAN:  Dennie Maizes, M.D.   DATE OF BIRTH:  23-Dec-1934   DATE OF PROCEDURE:  04/28/2005  DATE OF DISCHARGE:                                 OPERATIVE REPORT   PREOP DIAGNOSES:  1.  Vaginal erosion by urethral sling  2.  Post transvaginal tape procedure.   POSTOP DIAGNOSES:  1.  Vaginal erosion by urethral sling  2.  Post transvaginal tape procedure.   OPERATIVE PROCEDURE:  1.  Cystoscopy.  2.  Partial excision of the ureteral sling.   ANESTHESIA:  General.   SURGEON:  Dennie Maizes, M.D.   COMPLICATIONS:  None.   DRAINS:  None.   ESTIMATED BLOOD LOSS:  Minimal.   SPECIMEN:  Partial ureteral sling which was given to the patient.   COMPLICATIONS:  None.   INDICATIONS FOR PROCEDURE:  This 76 year old female has undergone  transvaginal tape procedure for stress incontinence in June 2006. She  recently came to the office with vaginal erosion by the urethral sling. She  was taken to the operating room, today, for cystoscopy and partial excision  of the urethral sling.   DESCRIPTION OF PROCEDURE:  General anesthesia was induced and the patient  was placed on the OR table in the dorsolithotomy position. The lower abdomen  and genitalia were prepped and draped in a sterile fashion. Cystoscopy was  done with the 25-French scope. The appearance of the bladder was normal.  There was no evidence of any urethral erosion of bladder erosion by the  sling. No abnormality was noted inside the bladder.   Examination revealed a vaginal erosion by about 1.5 cm size sling in the  midline. The ureter was then held with an Allis forceps. The exposed part of  the ureteral sling was then excised completely. About 1.5 cm length sling  was excised. The edges of the vaginal mucosa were then undermined  and  approximated using 3-0 Vicryl. The Foley catheter was in place during this  time to avoid injury to the urethra. The Foley catheter was removed after  closure of the incision. There was no active bleeding at this time. The  sponges and instruments were correct x2. The patient was transferred to the  PACU in satisfactory condition.      Dennie Maizes, M.D.  Electronically Signed     SK/MEDQ  D:  04/28/2005  T:  04/28/2005  Job:  [id number]   cc:   Patrica Duel, M.D.  Fax: [phone]

## 2010-07-24 NOTE — H&P (Signed)
NAMEVIRDELL, Linda Williamson                ACCOUNT NO.:  [id number]   MEDICAL RECORD NO.:  [id number]          PATIENT TYPE:  AMB   LOCATION:  DAY                           FACILITY:  [facility]   PHYSICIAN:  Dennie Maizes, M.D.   DATE OF BIRTH:  Sep 11, 1934   DATE OF ADMISSION:  04/28/2005  DATE OF DISCHARGE:  LH                                HISTORY & PHYSICAL   CHIEF COMPLAINT:  Vaginal erosion, post transvaginal tape procedure.   HISTORY OF PRESENT ILLNESS:  This 76 year old female was evaluated for  urinary leakage in 2006.  She had mixed hyperurinary incontinence.  She has  undergone transvaginal tape procedure for troublesome stress urinary  incontinence in June 2006.  She has done well postoperatively.  Recently  while having sex, her husband felt a knot in the vagina.  Evaluation in the  office revealed  vaginal erosion by suburethral tape.  The patient is doing  well.  She did not have any voiding difficulty, hematuria, dysuria, or  urinary tract infection.  She is brought to the operating room today for  cystoscopy and revision of the urethral sling.   PAST MEDICAL HISTORY:  1.  Depression and anxiety.  2.  Status post partial hysterectomy.  3.  Status post appendectomy.  4.  Status post cholecystectomy.  5.  Status post transvaginal tape procedure for stress urinary incontinence.   MEDICATIONS:  1.  Premarin 0.625 mg one p.o. daily.  2.  Vitamin D and vitamin C.   ALLERGIES:  PENICILLIN.   FAMILY HISTORY:  Positive for carcinoma of the bladder, heart disease and  heart failure.   PHYSICAL EXAMINATION:  HEENT:  Normal.  NECK:  No masses.  LUNGS:  Clear to auscultation.  HEART:  Regular rate and rhythm. No murmurs.  ABDOMEN:  No palpable flank masses.  No costovertebral angle tenderness.  Bladder is not palpable.  PELVIC:  Status post hysterectomy.  There is small area of vaginal erosion  in the anterior vaginal wall by the urethral tape.  A small loop of the tape  is  exposed.   IMPRESSION:  Vaginal erosion by urethral sling, post transvaginal tape  procedure.   PLAN:  Cystoscopy and excision of the urethral sling under anesthesia in  short-stay center.  I have discussed with the patient and her family  regarding the diagnosis, operative details, alternate treatments, outcome,  possible risks and complications and they have agreed for the procedure to  be done.      Dennie Maizes, M.D.  Electronically Signed     SK/MEDQ  D:  04/28/2005  T:  04/28/2005  Job:  [id number]   cc:   Patrica Duel, M.D.  Fax: [phone]

## 2010-07-24 NOTE — Op Note (Signed)
Linda Williamson, Linda Williamson                          ACCOUNT NO.:  [id number]   MEDICAL RECORD NO.:  [id number]                   PATIENT TYPE:  AMB   LOCATION:  DSC                                  FACILITY:   PHYSICIAN:  Jefry H. Pollyann Kennedy, M.D.                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   OPERATION/PROCEDURE:  Microlaryngoscopy with excision of right vocal cord  polyps.   SURGEON:  Jefry H. Pollyann Kennedy, M.D.   ANESTHESIA:  General endotracheal anesthesia.   COMPLICATIONS:  None.   ESTIMATED BLOOD LOSS:  None.   FINDINGS:  Left vocal cord anterior commissure and remainder of larynx look  completely clear and healthy.  Right vocal cord with Reinke's space edema  and polypoid degeneration with thick proteinaceous and mucoid-type material  buildup within the substrate of the polyp.  The patient tolerated the  procedure well.  Was awakened, extubated and transferred to recovery.   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  She has done well.  Has had no  evidence of recurrence but has continued hoarseness secondary to a polyp on  the right vocal cord.  Risks, benefits, alternatives, and complications of  the procedure were explained to the patient who seemed to understand and  agreed to surgery.   DESCRIPTION OF PROCEDURE:  The patient was taken to the operating room and  placed on the operating table in the supine position.  Following induction  of general endotracheal anesthesia, the table was turned 90 degrees and a  maxillary  tooth protector was used.  A Dedo laryngoscope was entered into  the oral cavity and used to visualize the larynx.  The suspension apparatus  was attached to the scope and placed on the Mayo stand.  Microscope with a  40 mm lens was brought into the fields.  Photographs were taken of the left  and right vocal cords separately.  The right vocal cord was injected with  lidocaine with epinephrine using a spinal needle.  Less than approximately  0.5 cc was injected.  A microlaryngoscopy upbiting scissor was used to  incise the mucosa of the floor of the ventricle.  Sharp dissection within  the substrate of the submucosal space was performed to free up the  proteinaceous material from the mucosa of the mid portion and inferior  aspect of the cord as well as from the vocal ligament.  It was  kept attached  to the mucosa of the floor of the ventricle.  The ventricular mucosa was  then incised allowing sufficient mucosa for approximation with the lateral  edge of the ventricular floor.  The lesion was sent for pathologic  evaluation.  There was well preserved mucosa with very good apposition and  no evidence of residual defect or residual swelling.  The scope was removed  and the patient was awakened, extubated and transported to recovery in  stable condition.                                               Jefry H. Pollyann Kennedy, M.D.    JHR/MEDQ  D:  06/04/2002  T:  06/04/2002  Job:  [id number]   cc:   Patrica Duel, M.D.  [address], [address]  [place]  [place] [address]  Fax: [phone]

## 2010-07-24 NOTE — Op Note (Signed)
NAME:  Linda Williamson, Linda Williamson                          ACCOUNT NO.:  [id number]   MEDICAL RECORD NO.:  [id number]                   PATIENT TYPE:  AMB   LOCATION:  DAY                                  FACILITY:  [facility]   PHYSICIAN:  Barbaraann Barthel, M.D.              DATE OF BIRTH:  02-20-1935   DATE OF PROCEDURE:  09/11/2003  DATE OF DISCHARGE:                                 OPERATIVE REPORT   SURGEON:  Barbaraann Barthel, M.D.   PREOPERATIVE DIAGNOSIS:  Cholecystitis, cholelithiasis.   POSTOPERATIVE DIAGNOSIS:  Cholecystitis, cholelithiasis.   PROCEDURE:  Laparoscopic cholecystectomy.   SPECIMENS:  Gallbladder with stones.   INDICATIONS FOR PROCEDURE:  This is a 76 year old white female who had  recurrent episodes of right upper quadrant pain, nausea, and vomiting.  She  was noted to have on CT scan cholelithiasis.  Later, sonogram revealed  cholelithiasis as well without any intrahepatic biliary radicle dilatation  or any abnormalities noted of the common bile duct.  Liver function studies  and amylase were within normal limits preoperatively.   We discussed elective cholecystectomy with this patient in detail,  discussion not limited to but including bleeding, infection, damage to bile  ducts, perforation of organs, and transitory diarrhea.  Informed consent was  obtained.   NOTE:  The patient has a past history of problems with her urethra, and we  had Dr. Rito Ehrlich cannulate her preoperatively.  For details of this, consult  his note.   GROSS OPERATIVE FINDINGS:  The patient had minimal adhesions around the  gallbladder, an olive-size stone within the gallbladder, normal-size cystic  duct and artery.  No other abnormalities were encountered.   TECHNIQUE:  The patient was placed in the supine position.  After the  adequate administration of general anesthesia via endotracheal intubation,  her entire abdomen was prepped with Betadine solution and draped in the  usual manner.  As  mentioned, the patient had a previously placed Foley  catheter in the outpatient department by Dr. Rito Ehrlich.   A periumbilical incision was carried out in the superior aspect of the  umbilicus.  A Veress needle was inserted, and the abdomen was insufflated  with approximately 3.2 L of CO2.  Using the Visiport technique, a 12-mm  cannula was placed in the umbilicus, and then under direct vision, another  10-mm cannula was placed in the epigastrium and two 5-mm cannulas in the  right upper quadrant laterally. The gallbladder was grasped.  There were  very minimal adhesions.  A small cystic duct was easily visualized.  This  was triply silver clipped and divided as was the cystic artery.  The  gallbladder was removed uneventfully with the hook cautery device.  It was  removed from the abdomen using the EndoCatch device.  We then controlled any  oozing with the hook cautery device.  After irrigation and checking for  hemostasis, we then desufflated the abdomen.  We  did elect to leave a piece  of Surgicel in the liver bed.  The abdomen was then closed using 0 Polysorb  to close the 10-mm cannula sites in the epigastrium and the umbilicus.  All  incisions were infiltrated with 0.5% Sensorcaine to help with postoperative  discomfort.  The skin was approximated with the stapling device.  Prior to  closure, all sponge, needle, and instrument counts were found to be correct.  Estimated blood loss was minimal.  The patient received 1100 cc of  crystalloid intraoperatively.  There were no drains and no complications.      ___________________________________________                                            Barbaraann Barthel, M.D.   WB/MEDQ  D:  09/11/2003  T:  09/11/2003  Job:  [id number]   cc:   [name], M.D.  [address]  [place]  [place] [address]  Fax: [phone]

## 2010-07-24 NOTE — Op Note (Signed)
Linda Williamson, Linda Williamson                          ACCOUNT NO.:  [id number]   MEDICAL RECORD NO.:  [id number]                   PATIENT TYPE:  OIB   LOCATION:  2899                                 FACILITY:  [facility]   PHYSICIAN:  Jefry H. Pollyann Kennedy, M.D.                DATE OF BIRTH:  1934/09/11   DATE OF PROCEDURE:  10/20/2001  DATE OF DISCHARGE:  10/20/2001                                 OPERATIVE REPORT   PREOPERATIVE DIAGNOSIS:  Squamous cell carcinoma, left vocal cord.   POSTOPERATIVE DIAGNOSIS:  Squamous cell carcinoma, left vocal cord.   PROCEDURE:  Microlaryngoscopy with excision and vocal cord stripping of left  vocal cord carcinoma.   SURGEON:  Jefry H. Pollyann Kennedy, M.D.   ANESTHESIA:  General endotracheal anesthesia.   COMPLICATIONS:  None.   ESTIMATED BLOOD LOSS:  None.   DISPOSITION:  The patient tolerated the procedure well, was awakened,  extubated, and transferred to recovery in stable condition.   HISTORY:  This is a 76 year old lady with recent biopsy-proven squamous cell  carcinoma of the left vocal cord.  The original biopsy revealed the lesion  to be more superficial than was thought clinically, and a decision was made  to perform repeat endoscopy in an attempt to completely excise the carcinoma  as opposed to treating with more extensive surgery or with external beam  radiotherapy.  Risks, benefits, alternatives, and complications of the  procedure were explained to the patient, who seemed to understand and agreed  to surgery.   DESCRIPTION OF PROCEDURE:  The patient was taken to the operating room and  placed on the operating table in the supine position.  Following induction  of general endotracheal anesthesia, the table was turned 90 degrees and the  patient was draped in the standard fashion.  The Dedo laryngoscope was  entered into the oral cavity, used to view the laryngeal structures, and  attached to the Mayo stand with the suspension apparatus.  A  maxillary tooth  protector was used.  The microscope was brought into place and used to  visualize the lesion on the left vocal cord.  Xylocaine 1% with epinephrine  was infiltrated using a spinal needle into the submucosal substance of the  left vocal cord.  Approximately 0.5 cc was injected.  Careful dissection  using sharp technique using scissors and microlaryngoscopy knife and forceps  was used to completely excise the lesion.  The vocal ligament was grossly  free of disease and was left intact.  A 2 mm margin of mucosa was taken on  all sides.  The vocal process of the  arytenoids was left uncovered, also seen to be clinically not involved.  The  lesion was removed in its entirety and sent for pathologic evaluation.  Topical adrenalin was applied for hemostasis.  The scope was removed.  The  patient was awakened, extubated, and transferred to recovery in stable  condition.                                               Jefry H. Pollyann Kennedy, M.D.    JHR/MEDQ  D:  10/20/2001  T:  10/23/2001  Job:  [id number]   cc:   Jonell Cluck, M.D.  [address], [address]  [place]  [place] [address]  Fax: [phone]

## 2011-04-06 ENCOUNTER — Other Ambulatory Visit: Payer: Self-pay | Admitting: Adult Health

## 2011-04-06 DIAGNOSIS — N63 Unspecified lump in unspecified breast: Secondary | ICD-10-CM

## 2011-04-14 ENCOUNTER — Ambulatory Visit (HOSPITAL_COMMUNITY)
Admission: RE | Admit: 2011-04-14 | Discharge: 2011-04-14 | Disposition: A | Discharge status: Home / Self Care | Payer: Medicare Other | Source: Ambulatory Visit | Attending: Adult Health | Admitting: Adult Health

## 2011-04-14 DIAGNOSIS — N63 Unspecified lump in unspecified breast: Secondary | ICD-10-CM

## 2011-04-14 DIAGNOSIS — N644 Mastodynia: Secondary | ICD-10-CM | POA: Insufficient documentation

## 2011-05-05 ENCOUNTER — Other Ambulatory Visit (HOSPITAL_COMMUNITY): Payer: Self-pay | Admitting: Internal Medicine

## 2011-05-05 ENCOUNTER — Ambulatory Visit (HOSPITAL_COMMUNITY)
Admission: RE | Admit: 2011-05-05 | Discharge: 2011-05-05 | Disposition: A | Discharge status: Home / Self Care | Payer: Medicare Other | Source: Ambulatory Visit | Attending: Internal Medicine | Admitting: Internal Medicine

## 2011-05-05 DIAGNOSIS — J449 Chronic obstructive pulmonary disease, unspecified: Secondary | ICD-10-CM | POA: Insufficient documentation

## 2011-05-05 DIAGNOSIS — R0789 Other chest pain: Secondary | ICD-10-CM

## 2011-05-05 DIAGNOSIS — J4489 Other specified chronic obstructive pulmonary disease: Secondary | ICD-10-CM | POA: Insufficient documentation

## 2011-12-18 ENCOUNTER — Emergency Department (HOSPITAL_COMMUNITY)
Admission: EM | Admit: 2011-12-18 | Discharge: 2011-12-18 | Disposition: A | Discharge status: Home / Self Care | Payer: Medicare Other | Attending: Emergency Medicine | Admitting: Emergency Medicine

## 2011-12-18 ENCOUNTER — Emergency Department (HOSPITAL_COMMUNITY): Payer: Medicare Other

## 2011-12-18 ENCOUNTER — Encounter (HOSPITAL_COMMUNITY): Payer: Self-pay | Admitting: *Deleted

## 2011-12-18 DIAGNOSIS — R209 Unspecified disturbances of skin sensation: Secondary | ICD-10-CM | POA: Insufficient documentation

## 2011-12-18 DIAGNOSIS — I7090 Unspecified atherosclerosis: Secondary | ICD-10-CM | POA: Insufficient documentation

## 2011-12-18 DIAGNOSIS — M47812 Spondylosis without myelopathy or radiculopathy, cervical region: Secondary | ICD-10-CM | POA: Insufficient documentation

## 2011-12-18 DIAGNOSIS — R202 Paresthesia of skin: Secondary | ICD-10-CM

## 2011-12-18 DIAGNOSIS — M542 Cervicalgia: Secondary | ICD-10-CM | POA: Insufficient documentation

## 2011-12-18 HISTORY — DX: Disorder of thyroid, unspecified: E07.9

## 2011-12-18 HISTORY — DX: Malignant (primary) neoplasm, unspecified: C80.1

## 2011-12-18 HISTORY — DX: Unspecified cataract: H26.9

## 2011-12-18 NOTE — ED Notes (Signed)
Patient sitting in chair in room with family member. States "I'm tired of laying in that bed." Patient inquiring about CT results. Advised patient results are not back yet. Family member states "She would be better off at home because she is normally in the bed by now." Patient stated she would wait for results.

## 2011-12-18 NOTE — ED Provider Notes (Signed)
History   This chart was scribed for Linda Hutching, MD scribed by Magnus Sinning. The patient was seen in room APA16A/APA16A 19:52   CSN: [id number]  Arrival date & time 12/18/11  1931   Chief Complaint  Patient presents with  . Numbness    (Consider location/radiation/quality/duration/timing/severity/associated sxs/prior treatment) The history is provided by the patient and a relative. No language interpreter was used.   Linda Williamson is a 77 y.o. female who presents to the Emergency Department complaining of constant moderate right hand numbness, onset this morning with associated right lower lateral neck soreness.Patient states when she got up this morning she noticed her right hand was numb and right lower lateral neck was sore. She states she felt fine yesterday evening and that complaints started today. Pt says she was doing extensive cleaning yesterday,which could've caused hand numbness. Pt states she has hx of arthritis, and notes it is currently numb. She denies any complaints in her legs. Past Medical History  Diagnosis Date  . Thyroid disease   . Cancer   . Cataract     Past Surgical History  Procedure Date  . Cholecystectomy   . Appendectomy   . Abdominal hysterectomy   . Throat surgery   . Cataract extraction     History reviewed. No pertinent family history.  History  Substance Use Topics  . Smoking status: Current Every Day Smoker -- 0.5 packs/day    Types: Cigarettes  . Smokeless tobacco: Not on file  . Alcohol Use: Yes    Review of Systems  All other systems reviewed and are negative.   10 Systems reviewed and are negative for acute change except as noted in the HPI. Allergies  Penicillins  Home Medications  No current outpatient prescriptions on file.  BP 114/52  Pulse 110  Resp 20  Ht 5\' 9"  (1.753 m)  Wt 175 lb (79.379 kg)  BMI 25.84 kg/m2  SpO2 96%  Physical Exam  Nursing note and vitals reviewed. Constitutional: She is oriented to  person, place, and time. She appears well-developed and well-nourished.  HENT:  Head: Normocephalic and atraumatic.  Eyes: Conjunctivae normal and EOM are normal. Pupils are equal, round, and reactive to light.  Neck: Normal range of motion. Neck supple.  Cardiovascular: Normal rate, regular rhythm and normal heart sounds.   Pulmonary/Chest: Effort normal and breath sounds normal.  Abdominal: Soft. Bowel sounds are normal.  Musculoskeletal: Normal range of motion.       Right hand: Normal.       Strength nml  Neurological: She is alert and oriented to person, place, and time. She has normal strength.  Skin: Skin is warm and dry.  Psychiatric: She has a normal mood and affect.    ED Course  Procedures (including critical care time) DIAGNOSTIC STUDIES: Oxygen Saturation is 96% on room air, normal by my interpretation.    COORDINATION OF CARE: 19:58: Provided intent to order CT of cervical spine. Pt agreeable.  Labs Reviewed - No data to display Ct Cervical Spine Wo Contrast  12/18/2011  *RADIOLOGY REPORT*  Clinical Data: Right-sided neck pain.  Right hand numbness.  CT CERVICAL SPINE WITHOUT CONTRAST  Technique:  Multidetector CT imaging of the cervical spine was performed. Multiplanar CT image reconstructions were also generated.  Comparison: MRI of the cervical spine 03/28/2006.  Findings: The cervical spine is imaged from skull base through C7- T1.  The vertebral body heights and alignment are normal. Rightward curvature is present in the lower cervical  spine. Vascular calcifications are noted within the vertebral arteries bilaterally, more promptly on the right.  Carotid atherosclerotic calcifications are present as well.  A broad-based disc osteophyte complex at C5-6 and C6-7 is similar to the prior exam.  IMPRESSION:  1.  No acute abnormality. 2.  Moderate spondylosis of cervical spine is most pronounced at C5- 6 and C6-7. 3.  Atherosclerosis.   Original Report Authenticated By:  Jamesetta Orleans. MATTERN, M.D.      No diagnosis found.    MDM  Moderate spondylosis at C5-6 and C6-7 noted. This could possibly be contributing to the numb right hand. Patient is alert and oriented x3. She has full range of motion of all 4 extremities. No slurred speech. No clinical evidence of a stroke. I personally performed the services described in this documentation, which was scribed in my presence. The recorded information has been reviewed and considered.         Linda Hutching, MD 12/18/11 2154

## 2011-12-18 NOTE — ED Notes (Signed)
Pt woke up this morning with numbness to right hand only, grips are equal

## 2011-12-23 ENCOUNTER — Other Ambulatory Visit (HOSPITAL_COMMUNITY): Payer: Self-pay | Admitting: Family Medicine

## 2011-12-23 DIAGNOSIS — IMO0002 Reserved for concepts with insufficient information to code with codable children: Secondary | ICD-10-CM

## 2011-12-23 DIAGNOSIS — M542 Cervicalgia: Secondary | ICD-10-CM

## 2011-12-27 ENCOUNTER — Ambulatory Visit (HOSPITAL_COMMUNITY)
Admission: RE | Admit: 2011-12-27 | Discharge: 2011-12-27 | Disposition: A | Discharge status: Home / Self Care | Payer: Medicare Other | Source: Ambulatory Visit | Attending: Family Medicine | Admitting: Family Medicine

## 2011-12-27 DIAGNOSIS — R209 Unspecified disturbances of skin sensation: Secondary | ICD-10-CM | POA: Insufficient documentation

## 2011-12-27 DIAGNOSIS — M542 Cervicalgia: Secondary | ICD-10-CM

## 2011-12-27 DIAGNOSIS — M5380 Other specified dorsopathies, site unspecified: Secondary | ICD-10-CM | POA: Insufficient documentation

## 2011-12-27 DIAGNOSIS — M79609 Pain in unspecified limb: Secondary | ICD-10-CM | POA: Insufficient documentation

## 2011-12-27 DIAGNOSIS — IMO0002 Reserved for concepts with insufficient information to code with codable children: Secondary | ICD-10-CM

## 2012-01-05 ENCOUNTER — Ambulatory Visit (HOSPITAL_COMMUNITY)
Admission: RE | Admit: 2012-01-05 | Discharge: 2012-01-05 | Disposition: A | Discharge status: Home / Self Care | Payer: Medicare Other | Source: Ambulatory Visit | Attending: Neurosurgery | Admitting: Neurosurgery

## 2012-01-05 DIAGNOSIS — IMO0001 Reserved for inherently not codable concepts without codable children: Secondary | ICD-10-CM | POA: Insufficient documentation

## 2012-01-05 DIAGNOSIS — M542 Cervicalgia: Secondary | ICD-10-CM | POA: Insufficient documentation

## 2012-01-05 DIAGNOSIS — R200 Anesthesia of skin: Secondary | ICD-10-CM | POA: Insufficient documentation

## 2012-01-05 DIAGNOSIS — M6281 Muscle weakness (generalized): Secondary | ICD-10-CM | POA: Insufficient documentation

## 2012-01-05 DIAGNOSIS — R209 Unspecified disturbances of skin sensation: Secondary | ICD-10-CM | POA: Insufficient documentation

## 2012-01-05 DIAGNOSIS — R202 Paresthesia of skin: Secondary | ICD-10-CM | POA: Insufficient documentation

## 2012-01-05 DIAGNOSIS — R29898 Other symptoms and signs involving the musculoskeletal system: Secondary | ICD-10-CM | POA: Insufficient documentation

## 2012-01-05 NOTE — Evaluation (Cosign Needed)
Physical Therapy Evaluation  Patient Details  Name: Linda Williamson MRN: [id number] Date of Birth: 03/10/1934  Today's Date: 01/05/2012 Time: 0865-7846 PT Time Calculation (min): 47 min  Visit#: 1  of 12   Re-eval: 01/26/12 (for MD appointment  on the 22nd) Assessment Diagnosis: cervical Spondylosis Next MD Visit: 01/29/12 Prior Therapy: none  Authorization: medicare   Authorization Visit#: 1  of 10    Past Medical History:  Past Medical History  Diagnosis Date  . Thyroid disease   . Cancer   . Cataract    Past Surgical History:  Past Surgical History  Procedure Date  . Cholecystectomy   . Appendectomy   . Abdominal hysterectomy   . Throat surgery   . Cataract extraction     Subjective Symptoms/Limitations Symptoms: Ms. Doering states that she has had neck pain for about a month.  She states she got up and her right arm was numb.  She had a CT and MRI which showed bone spurs in her neck.  She went to Dr. Wynetta Emery who has referred her to therapy.  She states at this time she is just numb from her wrist into her hand.   How long can you sit comfortably?: no change How long can you stand comfortably?: no change How long can you walk comfortably?: no change  Pain Assessment Currently in Pain?: No/denies (4/10 at the most in her neck)  Functional Tests Functional Tests: neck disability 22/45 (Pt does not read due to cateracts.)  Assessment RUE Strength Right Shoulder Flexion: 5/5 Right Shoulder Extension: 5/5 Right Shoulder Internal Rotation: 5/5 Right Shoulder External Rotation: 4/5 Right Elbow Flexion: 4/5 Right Elbow Extension: 3+/5 Right Wrist Flexion: 5/5 Right Wrist Extension: 5/5 Grip (lbs): R 48#, L 58# Cervical AROM Cervical Flexion: wnl  Cervical Extension: wnl Cervical - Right Side Bend: decreased 105 Cervical - Left Side Bend: wnl Cervical - Right Rotation: wnl Cervical - Left Rotation: decreased 10% Cervical Strength Cervical Extension:  3+/5 Cervical - Right Side Bend: 3+/5 Cervical - Left Side Bend: 3+/5  Exercise/Treatments Mobility/Balance  Posture/Postural Control Posture/Postural Control: Postural limitations Postural Limitations: forward head, increased kyphosis, rounded shoulders.     Seated Exercises Cervical Isometrics: Extension;Right lateral flexion;Left lateral flexion;5 reps Neck Retraction: 10 reps Other Seated Exercise: scapular retraction x 10 Other Seated Exercise: Pt given putty for hand grip.  Manual Therapy Manual Therapy: Other (comment) Other Manual Therapy: manual cervical traction x 7 minutes with positive results will use mechanical static traction at 17 # w/heat next treatment  Physical Therapy Assessment and Plan PT Assessment and Plan Clinical Impression Statement: PT with decreased strength and feeling in her Right hand who demonstrates posture deficiency's who will benefit from skilled PT.  Pt to recieve education in proper posture, neck care and strengthening exercises as well as mechanical traction to alleviate symptoms. Pt will benefit from skilled therapeutic intervention in order to improve on the following deficits: Decreased activity tolerance;Decreased range of motion;Pain;Decreased strength Rehab Potential: Good PT Frequency: Min 3X/week PT Duration: 4 weeks PT Treatment/Interventions: Modalities;Manual techniques;Patient/family education;Therapeutic exercise PT Plan: begin backward UBE, T-band ex, w-back as well as mechanical cervical traction; 3rd treatment add x to v, money and wall push up;  progress to prone axial extension and strengthening ex.    Goals PT Short Term Goals Time to Complete Short Term Goals: 2 weeks PT Short Term Goal 1: Pt to verbalize the importance in proper posture in the care of her neck PT Short Term Goal  2: Pt to state the numbness has decreased by 50% PT Short Term Goal 3: no complaint of pain PT Short Term Goal 4: ROM wnl PT Long Term  Goals Time to Complete Long Term Goals: 4 weeks PT Long Term Goal 1: I in advance HEP PT Long Term Goal 2: Pt hand and UE strength to be wnl Long Term Goal 3: Pt to state she has no numbness in her hand  Problem List Patient Active Problem List  Diagnosis  . SPONDYLOSIS  . DEGENERATIVE DISC DISEASE, LUMBAR SPINE  . BURSITIS, HIP  . OTHER DYSPHAGIA  . GASTROESOPHAGEAL REFLUX DISEASE, HX OF  . Weakness of right arm  . Numbness and tingling in right hand    PT - End of Session Activity Tolerance: Patient tolerated treatment well  GP Functional Assessment Tool Used: neck disability Functional Limitation: Changing and maintaining body position Changing and Maintaining Body Position Current Status (O1308): At least 40 percent but less than 60 percent impaired, limited or restricted Changing and Maintaining Body Position Goal Status (M5784): At least 1 percent but less than 20 percent impaired, limited or restricted  Chapin Arduini,CINDY 01/05/2012, 12:26 PM  Physician Documentation Your signature is required to indicate approval of the treatment plan as stated above.  Please sign and either send electronically or make a copy of this report for your files and return this physician signed original.   Please mark one 1.__approve of plan  2. ___approve of plan with the following conditions.   ______________________________                                                          _____________________ Physician Signature                                                                                                             Date

## 2012-01-07 ENCOUNTER — Ambulatory Visit (HOSPITAL_COMMUNITY)
Admission: RE | Admit: 2012-01-07 | Discharge: 2012-01-07 | Disposition: A | Discharge status: Home / Self Care | Payer: Medicare Other | Source: Ambulatory Visit | Attending: Neurosurgery | Admitting: Neurosurgery

## 2012-01-07 DIAGNOSIS — R202 Paresthesia of skin: Secondary | ICD-10-CM

## 2012-01-07 DIAGNOSIS — IMO0001 Reserved for inherently not codable concepts without codable children: Secondary | ICD-10-CM | POA: Insufficient documentation

## 2012-01-07 DIAGNOSIS — M6281 Muscle weakness (generalized): Secondary | ICD-10-CM | POA: Insufficient documentation

## 2012-01-07 DIAGNOSIS — R29898 Other symptoms and signs involving the musculoskeletal system: Secondary | ICD-10-CM

## 2012-01-07 DIAGNOSIS — M542 Cervicalgia: Secondary | ICD-10-CM | POA: Insufficient documentation

## 2012-01-07 DIAGNOSIS — R209 Unspecified disturbances of skin sensation: Secondary | ICD-10-CM | POA: Insufficient documentation

## 2012-01-07 NOTE — Progress Notes (Signed)
Physical Therapy Treatment Patient Details  Name: Linda Williamson MRN: [id number] Date of Birth: 12/06/1934  Today's Date: 01/07/2012 Time: 0930-1025 PT Time Calculation (min): 55 min  Visit#: 2  of 12   Re-eval: 01/26/12 (MD apt on 01/28/2012)  Charge: therex 30' , cervical traciton 17'  Authorization: Medicare  Authorization Time Period:    Authorization Visit#: 2  of 10    Subjective: Symptoms/Limitations Symptoms: Pt stated compliance with HEP.  Pain scale 3/10 cervical with numb R hand. Pain Assessment Currently in Pain?: Yes Pain Score:   3 Pain Location: Neck Pain Orientation: Posterior Pain Radiating Towards: R hand numbness  Objective:   Exercise/Treatments Machines for Strengthening UBE (Upper Arm Bike): 5' @ 1.0 backwards for posture Theraband Exercises Scapula Retraction: 10 reps;Green Shoulder Extension: 10 reps;Green Rows: 10 reps;Green Seated Exercises Neck Retraction: 10 reps;5 secs W Back: 15 reps Shoulder Rolls: Backwards;15 reps Other Seated Exercise: scapular retraction x 10  Modalities Modalities: Moist Heat;Traction Moist Heat Therapy Number Minutes Moist Heat: 17 Minutes Moist Heat Location:  (cervical) Traction Type of Traction: Cervical Min (lbs): n/a Max (lbs): 17 Hold Time: static Rest Time: n/a Time: 17'  Physical Therapy Assessment and Plan PT Assessment and Plan Clinical Impression Statement: Began treatment for postural strengthening and mechanical cervical traction to alleviate symptoms R UE.  Pt with multimodal cueing required for posture with exercises.  Pt encouraged to drink extra water following  new modality to reduce risk of headaches.   PT Plan: Next session add x to v, money and wall push up; progress to prone axial extension and strengthening ex.    Goals    Problem List Patient Active Problem List  Diagnosis  . SPONDYLOSIS  . DEGENERATIVE DISC DISEASE, LUMBAR SPINE  . BURSITIS, HIP  . OTHER DYSPHAGIA  .  GASTROESOPHAGEAL REFLUX DISEASE, HX OF  . Weakness of right arm  . Numbness and tingling in right hand    PT - End of Session Activity Tolerance: Patient tolerated treatment well General Behavior During Session: [facility] for tasks performed Cognition: [facility] for tasks performed  GP    Juel Burrow 01/07/2012, 10:52 AM

## 2012-01-10 ENCOUNTER — Ambulatory Visit (HOSPITAL_COMMUNITY)
Admission: RE | Admit: 2012-01-10 | Discharge: 2012-01-10 | Disposition: A | Discharge status: Home / Self Care | Payer: Medicare Other | Source: Ambulatory Visit | Attending: Neurosurgery | Admitting: Neurosurgery

## 2012-01-10 DIAGNOSIS — R200 Anesthesia of skin: Secondary | ICD-10-CM

## 2012-01-10 DIAGNOSIS — R29898 Other symptoms and signs involving the musculoskeletal system: Secondary | ICD-10-CM

## 2012-01-10 NOTE — Progress Notes (Signed)
Physical Therapy Treatment Patient Details  Name: Linda Williamson MRN: [id number] Date of Birth: 1934-03-24  Today's Date: 01/10/2012 Time: 2841-3244 PT Time Calculation (min): 46 min  Visit#: 3  of 12   Re-eval: 01/26/12    Authorization: medicare  Authorization Time Period:    Authorization Visit#: 3  of 10    Subjective:  Pt states she has been doing all her exercises at home.  Pt verbalized interest in having t-band for home use; explained to pt that as soon as she could demonstrate the ability to use the theraband without verbal or manual cuing we would give her some for home use.   Exercise/Treatments   Machines for Strengthening UBE (Upper Arm Bike): 4'@2 .0 Theraband Exercises Scapula Retraction: 10 reps;Green Shoulder Extension: 10 reps;Green Rows: 10 reps;Green Standing Exercises Wall Push Ups: 10 reps Other Standing Exercises: chest stretch x 10 Seated Exercises Neck Retraction: 10 reps;5 secs X to V: 10 reps;Weight W Back: 10 reps;Weight W Back Weights (lbs): 2 Money: 10 reps Other Seated Exercise: scapular retraction x 10   Modalities Modalities: Moist Heat;Traction Moist Heat Therapy Number Minutes Moist Heat: 17 Minutes Moist Heat Location:  (upper back) Traction Type of Traction: Cervical Min (lbs): 12 Max (lbs): 17 Hold Time: 60 Rest Time: 40 Time: 15  Physical Therapy Assessment and Plan PT Assessment and Plan Clinical Impression Statement: changed traction to intermittent to decrease soreness.  Pt needs cuing verbal and tactile for new and previous exercises.  Pt verbalized intrest in t-band to take home; explained that as soon as she can demonstrate good form without cuing we will give her t-band for home use. PT Plan: begin prone exercises including axial extension    Goals    Problem List Patient Active Problem List  Diagnosis  . SPONDYLOSIS  . DEGENERATIVE DISC DISEASE, LUMBAR SPINE  . BURSITIS, HIP  . OTHER DYSPHAGIA  .  GASTROESOPHAGEAL REFLUX DISEASE, HX OF  . Weakness of right arm  . Numbness and tingling in right hand    PT - End of Session Activity Tolerance: Patient tolerated treatment well  GP    RUSSELL,CINDY 01/10/2012, 2:03 PM

## 2012-01-12 ENCOUNTER — Ambulatory Visit (HOSPITAL_COMMUNITY)
Admission: RE | Admit: 2012-01-12 | Discharge: 2012-01-12 | Disposition: A | Discharge status: Home / Self Care | Payer: Medicare Other | Source: Ambulatory Visit | Attending: *Deleted | Admitting: *Deleted

## 2012-01-12 NOTE — Progress Notes (Signed)
Physical Therapy Treatment Patient Details  Name: Linda Williamson MRN: [id number] Date of Birth: March 26, 1934  Today's Date: 01/12/2012 Time: 1478-2956 PT Time Calculation (min): 44 min  Visit#: 4  of 12   Re-eval: 01/26/12 Charges: Traction w/MHP x 17' Therex x 23'  Authorization: medicare  Authorization Visit#: 4  of 10    Subjective: Symptoms/Limitations Symptoms: Pt reports no pain only numbness and tingling in R arm.  Pain Assessment Currently in Pain?: No/denies   Exercise/Treatments Machines for Strengthening UBE (Upper Arm Bike): 4'@2 .0 Theraband Exercises Scapula Retraction: 10 reps;Green Shoulder Extension: 10 reps;Green Rows: 10 reps;Green Standing Exercises Wall Push Ups: 10 reps Seated Exercises Neck Retraction: 10 reps;5 secs X to V: 10 reps W Back: 10 reps;Weight W Back Weights (lbs): 2 Prone Exercises Axial Exentsion: 10 reps Shoulder Extension: 10 reps Rows: 10 reps  Modalities Modalities: Moist Heat;Traction Moist Heat Therapy Number Minutes Moist Heat: 17 Minutes Moist Heat Location: Other (comment) (upper back) Traction Type of Traction: Cervical Max (lbs): 17 Hold Time: Static Time: 15 (17')  Physical Therapy Assessment and Plan PT Assessment and Plan Clinical Impression Statement: Traction continued secondary to decreased radicular sx with previous tx. Changed to static hold with traction as pt was not sore after intermittent tx last session. Pt requires multimodal cueing with all exercises for proper form and scapular mm contraction. Pt reports no radicular sx at end of session.  PT Plan: Continue to progress postural strength and decrease radicular sx per PT POC.     Problem List Patient Active Problem List  Diagnosis  . SPONDYLOSIS  . DEGENERATIVE DISC DISEASE, LUMBAR SPINE  . BURSITIS, HIP  . OTHER DYSPHAGIA  . GASTROESOPHAGEAL REFLUX DISEASE, HX OF  . Weakness of right arm  . Numbness and tingling in right hand    PT - End  of Session Activity Tolerance: Patient tolerated treatment well General Behavior During Session: [facility] for tasks performed Cognition: [facility] for tasks performed  Seth Bake, PTA 01/12/2012, 11:51 AM

## 2012-01-14 ENCOUNTER — Ambulatory Visit (HOSPITAL_COMMUNITY)
Admission: RE | Admit: 2012-01-14 | Discharge: 2012-01-14 | Disposition: A | Discharge status: Home / Self Care | Payer: Medicare Other | Source: Ambulatory Visit | Attending: Neurosurgery | Admitting: Neurosurgery

## 2012-01-14 DIAGNOSIS — R202 Paresthesia of skin: Secondary | ICD-10-CM

## 2012-01-14 DIAGNOSIS — R29898 Other symptoms and signs involving the musculoskeletal system: Secondary | ICD-10-CM

## 2012-01-14 NOTE — Progress Notes (Addendum)
Physical Therapy Treatment Patient Details  Name: Linda Williamson MRN: [id number] Date of Birth: 09-02-1934  Today's Date: 01/14/2012 Time: 0930-1020 PT Time Calculation (min): 50 min  Visit#: 5  of 12   Re-eval: 01/26/12 (MD apt 01/27/2012) Assessment Diagnosis: cervical Spondylosis Next MD Visit: Remigio Eisenmenger 01/27/2012 or 01/29/2012  Authorization: Medicare  Authorization Time Period:    Authorization Visit#: 5  of 10   Charge: therex 35', Cervical traction x 17', MHP x 1  Subjective: Symptoms/Limitations Symptoms: Pt reported decreased radicular symptoms R arm and no pain.  Pt does complain of slight medial nerve radicular symptoms, no ulnar. Pain Assessment Currently in Pain?: No/denies  Objective:   Exercise/Treatments Machines for Strengthening UBE (Upper Arm Bike): 4'@2 .0 Theraband Exercises Scapula Retraction: 10 reps;Green Shoulder Extension: 10 reps;Green Rows: 10 reps;Green Standing Exercises Wall Push Ups: 10 reps Other Standing Exercises: medial nerve 3x 30" with decreased numbness following  Other Standing Exercises: angel wings for lower trap activation for postural strengthening Seated Exercises Neck Retraction: 10 reps;5 secs X to V: 10 reps W Back: 10 reps;Weight W Back Weights (lbs): 2 Other Seated Exercise: scapular retraction x 10  Modalities Modalities: Moist Heat;Traction Moist Heat Therapy Number Minutes Moist Heat: 17 Minutes Moist Heat Location:  (upperback) Traction Type of Traction: Cervical Min (lbs): n/a Max (lbs): 17 Hold Time: static Rest Time: static Time: 17'  Physical Therapy Assessment and Plan PT Assessment and Plan Clinical Impression Statement: Added medial nerve st. with decreased radicular symptoms and continued cervical traction with no radicular symptoms following.  Pt given printout for proper form with medial nerve to add to HEP.  Added angel wings for lower trap activation with max cueing for proper form/technique.  Pt  continues to require multimodal cueing with all exercises for cervical and scapular retraction for posture strengthening/ PT Plan: Continue to progress postural strength and decrease radicular sx per PT POC    Goals    Problem List Patient Active Problem List  Diagnosis  . SPONDYLOSIS  . DEGENERATIVE DISC DISEASE, LUMBAR SPINE  . BURSITIS, HIP  . OTHER DYSPHAGIA  . GASTROESOPHAGEAL REFLUX DISEASE, HX OF  . Weakness of right arm  . Numbness and tingling in right hand    PT - End of Session Activity Tolerance: Patient tolerated treatment well General Behavior During Session: [facility] for tasks performed Cognition: [facility] for tasks performed  GP    Juel Burrow 01/14/2012, 11:09 AM

## 2012-01-17 ENCOUNTER — Ambulatory Visit (HOSPITAL_COMMUNITY)
Admission: RE | Admit: 2012-01-17 | Discharge: 2012-01-17 | Disposition: A | Discharge status: Home / Self Care | Payer: Medicare Other | Source: Ambulatory Visit | Attending: Neurosurgery | Admitting: Neurosurgery

## 2012-01-17 DIAGNOSIS — R200 Anesthesia of skin: Secondary | ICD-10-CM

## 2012-01-17 DIAGNOSIS — R29898 Other symptoms and signs involving the musculoskeletal system: Secondary | ICD-10-CM

## 2012-01-17 NOTE — Progress Notes (Signed)
Physical Therapy Treatment Patient Details  Name: Linda Williamson MRN: [id number] Date of Birth: [birth year]  Today's Date: 01/17/2012 Time: 0930-1021 PT Time Calculation (min): 51 min  Visit#: 6  of 12   Re-eval: 01/26/12 (MD appointment )    Authorization:    Authorization Time Period:    Authorization Visit#: 6  of 10    Subjective: Symptoms/Limitations Symptoms: Pt states she is just having some numbness in the ulnar distribution of the R UE.    Exercise/Treatments   Machines for Strengthening UBE (Upper Arm Bike): 4'@2 .0   Standing Exercises Neck Retraction: 10 reps Wall Push Ups: 10 reps Upper Extremity Flexion with Stabilization: 10 reps Other Standing Exercises: medial nerve 3x 30" with decreased numbness following  Other Standing Exercises: angel wings for lower trap activation for postural strengthening Seated Exercises Neck Retraction: 10 reps;5 secs X to V: 10 reps W Back: 10 reps;Weight W Back Weights (lbs): 2 Other Seated Exercise: scapular retraction x 10     Modalities Modalities: Moist Heat;Traction Moist Heat Therapy Number Minutes Moist Heat: 15 Minutes Moist Heat Location:  (upper back) Traction Type of Traction: Cervical Max (lbs): 17 min Hold Time: static  Physical Therapy Assessment and Plan PT Assessment and Plan Clinical Impression Statement: Pt needs constant verbal and tactile curing to keep proper neck retraction with exercises.  Pt doing t-band ex at home will d/c from program. PT Plan: resume prone exercises to strength scapular and cervical mm.    Goals    Problem List Patient Active Problem List  Diagnosis  . SPONDYLOSIS  . DEGENERATIVE DISC DISEASE, LUMBAR SPINE  . BURSITIS, HIP  . OTHER DYSPHAGIA  . GASTROESOPHAGEAL REFLUX DISEASE, HX OF  . Weakness of right arm  . Numbness and tingling in right hand    General Behavior During Session: [facility] for tasks performed  GP    RUSSELL,CINDY 01/17/2012, 10:11 AM

## 2012-01-19 ENCOUNTER — Ambulatory Visit (HOSPITAL_COMMUNITY)
Admission: RE | Admit: 2012-01-19 | Discharge: 2012-01-19 | Disposition: A | Discharge status: Home / Self Care | Payer: Medicare Other | Source: Ambulatory Visit | Attending: Neurosurgery | Admitting: Neurosurgery

## 2012-01-19 NOTE — Progress Notes (Signed)
Physical Therapy Treatment Patient Details  Name: Linda Williamson MRN: [id number] Date of Birth: September 04, 1934  Today's Date: 01/19/2012 Time: 0910-0950 PT Time Calculation (min): 40 min  Visit#: 7  of 12   Re-eval: 01/26/12 (MD appointment ) Charges: Therex x 38'  Authorization: Medicare Authorization Visit#: 7  of 10    Subjective: Symptoms/Limitations Symptoms: Pt is not currently having and radicular sx, only soreness in the scapular area. Pain Assessment Currently in Pain?: No/denies   Exercise/Treatments Standing Exercises Neck Retraction: 10 reps;Limitations Neck Retraction Limitations: isometric against wall  Wall Push Ups: 10 reps Seated Exercises X to V: 10 reps W Back: 10 reps;Weight W Back Weights (lbs): 2 Prone Exercises W Back: 10 reps Shoulder Extension: 10 reps Rows: 10 reps Other Prone Exercise: SAR B x 10  Physical Therapy Assessment and Plan PT Assessment and Plan Clinical Impression Statement: Pt requires multimodal cueing for proper form with all scapular strengthening exercises. Traction held this session as pt did not have any radicular sx throughout tx. Pt reports decreased soreness at end of session.  PT Plan: Continue to progress scapular and cervical strengthening per PT POC.     Problem List Patient Active Problem List  Diagnosis  . SPONDYLOSIS  . DEGENERATIVE DISC DISEASE, LUMBAR SPINE  . BURSITIS, HIP  . OTHER DYSPHAGIA  . GASTROESOPHAGEAL REFLUX DISEASE, HX OF  . Weakness of right arm  . Numbness and tingling in right hand    PT - End of Session Activity Tolerance: Patient tolerated treatment well General Behavior During Session: [facility] for tasks performed Cognition: [facility] for tasks performed  Seth Bake, PTA 01/19/2012, 11:06 AM

## 2012-01-21 ENCOUNTER — Ambulatory Visit (HOSPITAL_COMMUNITY)
Admission: RE | Admit: 2012-01-21 | Discharge: 2012-01-21 | Disposition: A | Discharge status: Home / Self Care | Payer: Medicare Other | Source: Ambulatory Visit | Attending: Neurosurgery | Admitting: Neurosurgery

## 2012-01-21 DIAGNOSIS — R29898 Other symptoms and signs involving the musculoskeletal system: Secondary | ICD-10-CM

## 2012-01-21 DIAGNOSIS — R200 Anesthesia of skin: Secondary | ICD-10-CM

## 2012-01-21 NOTE — Progress Notes (Signed)
Physical Therapy Treatment Patient Details  Name: Linda Williamson MRN: [id number] Date of Birth: [birth year]  Today's Date: 01/21/2012 Time: 0900-0938 PT Time Calculation (min): 38 min  Visit#: 8  of 12   Re-eval: 01/26/12 (prior MD apt 01/26/2102) Assessment Diagnosis: cervical Spondylosis Next MD Visit: Remigio Eisenmenger 01/27/2012  Charge: therex 38'  Authorization: Medicare  Authorization Time Period:    Authorization Visit#: 8  of 10    Subjective: Symptoms/Limitations Symptoms: Pt reported pain free today and no radiculat symptoms. Pain Assessment Currently in Pain?: No/denies  Objective:   Exercise/Treatments Standing Exercises Neck Retraction: 5 reps;Limitations Neck Retraction Limitations: isometric against wall with tactile cueing for proper technique/form Wall Push Ups: 10 reps Upper Extremity Flexion with Stabilization: 10 reps Other Standing Exercises: elevate/depress; scapular retract/protract 3x 3 Other Standing Exercises: angel wings for lower trap activation for postural strengthening Seated Exercises X to V: 15 reps W Back: 15 reps;Weight W Back Weights (lbs): 2 Prone Exercises Neck Retraction: 10 reps;5 secs;Limitations Neck Retraction Limitations: chin tuck head lift 10x 5" W Back: 15 reps Shoulder Extension: 15 reps Rows: 15 reps Other Prone Exercise: SAR B 10x 5" with tactile cueing  Physical Therapy Assessment and Plan PT Assessment and Plan Clinical Impression Statement: Pt able to demonstrate good form/technique with prone chin tuck head lift but does require multimodal cueing for cervical and scapular retraction with standing exercises.  Held traction and modality this session, no radicular symptoms or reports of pain thoughout session.   PT Plan: Reassess in 2 more sessions prior MD apt on 11/21, continue to progress scapular and cervical strengthening per PT POC.    Goals    Problem List Patient Active Problem List  Diagnosis  . SPONDYLOSIS  .  DEGENERATIVE DISC DISEASE, LUMBAR SPINE  . BURSITIS, HIP  . OTHER DYSPHAGIA  . GASTROESOPHAGEAL REFLUX DISEASE, HX OF  . Weakness of right arm  . Numbness and tingling in right hand    PT - End of Session Activity Tolerance: Patient tolerated treatment well General Behavior During Session: [facility] for tasks performed Cognition: [facility] for tasks performed  GP    Juel Burrow 01/21/2012, 9:38 AM

## 2012-01-22 ENCOUNTER — Emergency Department (HOSPITAL_COMMUNITY)
Admission: EM | Admit: 2012-01-22 | Discharge: 2012-01-22 | Disposition: A | Discharge status: Home / Self Care | Payer: Medicare Other | Attending: Emergency Medicine | Admitting: Emergency Medicine

## 2012-01-22 ENCOUNTER — Encounter (HOSPITAL_COMMUNITY): Payer: Self-pay

## 2012-01-22 DIAGNOSIS — I10 Essential (primary) hypertension: Secondary | ICD-10-CM | POA: Insufficient documentation

## 2012-01-22 DIAGNOSIS — Z8669 Personal history of other diseases of the nervous system and sense organs: Secondary | ICD-10-CM | POA: Insufficient documentation

## 2012-01-22 DIAGNOSIS — R040 Epistaxis: Secondary | ICD-10-CM | POA: Insufficient documentation

## 2012-01-22 DIAGNOSIS — F17200 Nicotine dependence, unspecified, uncomplicated: Secondary | ICD-10-CM | POA: Insufficient documentation

## 2012-01-22 DIAGNOSIS — E079 Disorder of thyroid, unspecified: Secondary | ICD-10-CM | POA: Insufficient documentation

## 2012-01-22 DIAGNOSIS — Z79899 Other long term (current) drug therapy: Secondary | ICD-10-CM | POA: Insufficient documentation

## 2012-01-22 DIAGNOSIS — R062 Wheezing: Secondary | ICD-10-CM | POA: Insufficient documentation

## 2012-01-22 MED ORDER — LORAZEPAM 2 MG/ML IJ SOLN
1.0000 mg | Freq: Once | INTRAMUSCULAR | Status: DC
  Filled 2012-01-22: qty 1

## 2012-01-22 MED ORDER — OXYMETAZOLINE HCL 0.05 % NA SOLN
1.0000 | Freq: Once | NASAL | Status: AC
  Administered 2012-01-22: 1 via NASAL

## 2012-01-22 MED ORDER — SILVER NITRATE-POT NITRATE 75-25 % EX MISC
CUTANEOUS | Status: AC
  Administered 2012-01-22: 20:00:00
  Filled 2012-01-22: qty 1

## 2012-01-22 MED ORDER — LORAZEPAM 2 MG/ML IJ SOLN
1.0000 mg | Freq: Once | INTRAMUSCULAR | Status: AC
  Administered 2012-01-22: 1 mg via INTRAVENOUS

## 2012-01-22 MED ORDER — ALPRAZOLAM 0.5 MG PO TABS
0.5000 mg | ORAL_TABLET | Freq: Once | ORAL | Status: AC
  Administered 2012-01-22: 0.5 mg via ORAL
  Filled 2012-01-22: qty 1

## 2012-01-22 MED ORDER — OXYMETAZOLINE HCL 0.05 % NA SOLN
NASAL | Status: AC
  Administered 2012-01-22: 18:00:00
  Filled 2012-01-22: qty 15

## 2012-01-22 NOTE — ED Provider Notes (Addendum)
History   This chart was scribed for Benny Lennert, MD scribed by Magnus Sinning. The patient was seen in room APA02/APA02 at 17:46  CSN: [id number]  Arrival date & time 01/22/12  1736    Chief Complaint  Patient presents with  . Epistaxis  . Hypertension    (Consider location/radiation/quality/duration/timing/severity/associated sxs/prior treatment) HPI Comments: Linda Williamson is a 77 y.o. Female BIB EMS who presents to the Emergency Department complaining of constant moderate epistaxis of both nares.  Pt denies hx of epistaxis and reports she only takes naproxen for occasional shoulder pain. She denies use of ASA or any blood pressure medications.  Patient is a 77 y.o. female presenting with nosebleeds and hypertension. The history is provided by the patient. No language interpreter was used.  Epistaxis  This is a new problem. The current episode started less than 1 hour ago. The problem occurs constantly. The problem has not changed since onset.The problem is associated with an unknown factor. The bleeding has been from both nares. She has tried applying pressure for the symptoms. The treatment provided no relief. Her past medical history does not include frequent nosebleeds.  Hypertension    Past Medical History  Diagnosis Date  . Thyroid disease   . Cancer   . Cataract     Past Surgical History  Procedure Date  . Cholecystectomy   . Appendectomy   . Abdominal hysterectomy   . Throat surgery   . Cataract extraction     No family history on file.  History  Substance Use Topics  . Smoking status: Current Every Day Smoker -- 0.5 packs/day    Types: Cigarettes  . Smokeless tobacco: Not on file  . Alcohol Use: Yes    Review of Systems  HENT: Positive for nosebleeds.   All other systems reviewed and are negative.    Allergies  Penicillins  Home Medications   Current Outpatient Rx  Name  Route  Sig  Dispense  Refill  . ALPRAZOLAM 0.5 MG PO TABS   Oral   Take 0.5 mg by mouth at bedtime.         Marland Kitchen LEVOTHYROXINE SODIUM 50 MCG PO TABS   Oral   Take 50 mcg by mouth daily.           BP 153/76  Pulse 86  Temp 97.7 F (36.5 C)  Resp 20  SpO2 100%  Physical Exam  Nursing note and vitals reviewed. Constitutional: She is oriented to person, place, and time. She appears well-developed and well-nourished. No distress.  HENT:  Head: Normocephalic and atraumatic.       Fresh blood in both nostrils and mouth  Eyes: Conjunctivae normal and EOM are normal. No scleral icterus.  Neck: Normal range of motion. Neck supple.  Pulmonary/Chest: Effort normal. No respiratory distress. She has wheezes. She has no rales. She exhibits no tenderness.       Mild wheezing  Abdominal: She exhibits no distension.  Musculoskeletal: Normal range of motion. She exhibits no edema.  Neurological: She is alert and oriented to person, place, and time. No sensory deficit.  Skin: Skin is warm and dry. No rash noted. No erythema.  Psychiatric: She has a normal mood and affect. Her behavior is normal.    ED Course  EPISTAXIS MANAGEMENT Performed by: Tricia Pledger L Authorized by: Caige Almeda L Comments: Pt had an anterior bleed in right nostril.  Bleeding stopped by silver nitrate stick   (including critical care time) DIAGNOSTIC STUDIES:  Oxygen Saturation is 100% on room air, normal by my interpretation.    COORDINATION OF CARE: 17:47: Physical exam performed. Afrin ordered.  Labs Reviewed - No data to display No results found.   No diagnosis found. Bleeding began again.  Both nostrils packed with vasaline gauze  MDM   The chart was scribed for me under my direct supervision.  I personally performed the history, physical, and medical decision making and all procedures in the evaluation of this patient.Benny Lennert, MD 01/22/12 1952  Benny Lennert, MD 01/22/12 2009

## 2012-01-22 NOTE — ED Notes (Signed)
Pt made it to the parking lot & nose started bleeding again. Pt returned back to the ER.

## 2012-01-22 NOTE — ED Notes (Signed)
Pt alert & oriented x4, stable gait. Patient given discharge instructions, paperwork & prescription(s). Patient  instructed to stop at the registration desk to finish any additional paperwork. Patient verbalized understanding. Pt left department w/ no further questions. 

## 2012-01-22 NOTE — ED Notes (Signed)
Pt began having a nosebleed apporx 30 min ago, arrived from home by ems, bp also elevated.

## 2012-01-22 NOTE — ED Notes (Signed)
No drainage noted at this time from nasal packing.

## 2012-01-22 NOTE — ED Notes (Signed)
EDP checked pt again & states OK for pt to leave again.

## 2012-01-24 ENCOUNTER — Ambulatory Visit (HOSPITAL_COMMUNITY): Payer: Medicare Other | Admitting: Physical Therapy

## 2012-01-26 ENCOUNTER — Ambulatory Visit (HOSPITAL_COMMUNITY): Payer: Medicare Other | Admitting: *Deleted

## 2012-01-27 ENCOUNTER — Ambulatory Visit (INDEPENDENT_AMBULATORY_CARE_PROVIDER_SITE_OTHER): Payer: Medicare Other | Admitting: Otolaryngology

## 2012-01-27 DIAGNOSIS — R040 Epistaxis: Secondary | ICD-10-CM

## 2012-01-28 ENCOUNTER — Ambulatory Visit (HOSPITAL_COMMUNITY): Payer: Medicare Other | Admitting: Physical Therapy

## 2012-02-07 ENCOUNTER — Ambulatory Visit (HOSPITAL_COMMUNITY): Payer: Medicare Other | Admitting: Physical Therapy

## 2012-02-08 ENCOUNTER — Ambulatory Visit (HOSPITAL_COMMUNITY)
Admission: RE | Admit: 2012-02-08 | Discharge: 2012-02-08 | Disposition: A | Discharge status: Home / Self Care | Payer: Medicare Other | Source: Ambulatory Visit | Attending: Neurosurgery | Admitting: Neurosurgery

## 2012-02-08 DIAGNOSIS — IMO0001 Reserved for inherently not codable concepts without codable children: Secondary | ICD-10-CM | POA: Insufficient documentation

## 2012-02-08 DIAGNOSIS — R209 Unspecified disturbances of skin sensation: Secondary | ICD-10-CM | POA: Insufficient documentation

## 2012-02-08 DIAGNOSIS — M6281 Muscle weakness (generalized): Secondary | ICD-10-CM | POA: Insufficient documentation

## 2012-02-08 DIAGNOSIS — M542 Cervicalgia: Secondary | ICD-10-CM | POA: Insufficient documentation

## 2012-02-08 NOTE — Progress Notes (Signed)
Physical Therapy Re-evaluation / Discharge  Patient Details  Name: Linda Williamson MRN: [id number] Date of Birth: March 04, 1935  Today's Date: 02/08/2012 Time: 1018-1100 PT Time Calculation (min): 42 min  Visit#: 9  of 12   Re-eval: 01/26/12 (prior MD apt 01/26/2102) Assessment Diagnosis: cervical Spondylosis Next MD Visit: Remigio Eisenmenger 01/27/2012  Charges:  MMT, ROM testing, PPT 15' Authorization: Medicare  Authorization Visit#: 9  of 10     Subjective Symptoms/Limitations Symptoms: Pt. states her nose began bleeding badly and it took 4 hours to stop the bleeding.  Also bleeding from her eyes.  They had to cauterize her nose.  BP taken upon entrance 170/102 mmHg by Annett Fabian, PT. Pain Assessment Currently in Pain?: No/denies  Sensation/Coordination/Flexibility/Functional Tests Functional Tests Functional Tests: neck disability of 5% (5/45) (was 22% (22/45))  Objective: RUE Strength Right Shoulder Flexion: 5/5 Right Shoulder Extension: 5/5 Right Shoulder Internal Rotation: 5/5 Right Shoulder External Rotation: 4/5 (was 4/5) Right Elbow Flexion: 5/5 (was 4/5) Right Elbow Extension: 5/5 (was 3+/5) Right Wrist Flexion: 0/5 Right Wrist Extension: 0/5  Grip (lbs): R 60#, L 65# (was R 48#, L 58#)  Cervical AROM Cervical Flexion: WNL Cervical Extension: WNL Cervical - Right Side Bend: WNL (was decreased 10%) Cervical - Left Side Bend: WNL Cervical - Right Rotation: WNL Cervical - Left Rotation: WNL (was decreased 10%)  Cervical Strength Cervical Extension: 5/5 (was 3+/5) Cervical - Right Side Bend: 5/5 (was 3+/5) Cervical - Left Side Bend: 5/5 (was 3+/5)   Physical Therapy Assessment and Plan PT Assessment and Plan Clinical Impression Statement: Pt. has met all STG's/ and 2/3 LTG's.  Pt has increased grip strength 12# bilaterally and decrease to 5% perceived neck disability (was 22%).  Pt. is compliant and independent with HEP and feels confident to continue HEP on own. PT  Plan: Suggest discharge to HEP as goals met and pt. compliant/Independent with HEP.    Goals Home Exercise Program Pt will Perform Home Exercise Program: Independently PT Goal: Perform Home Exercise Program - Progress: Met  PT Short Term Goals Time to Complete Short Term Goals: 2 weeks PT Short Term Goal 1: Pt to verbalize the importance in proper posture in the care of her neck PT Short Term Goal 1 - Progress: Met PT Short Term Goal 2: Pt to state the numbness has decreased by 50% PT Short Term Goal 2 - Progress: Met (75% reduction, mostly in R hand) PT Short Term Goal 3: no complaint of pain PT Short Term Goal 3 - Progress: Met PT Short Term Goal 4: ROM WNL PT Short Term Goal 4 - Progress: Met  PT Long Term Goals Time to Complete Long Term Goals: 4 weeks PT Long Term Goal 1: I in advance HEP PT Long Term Goal 1 - Progress: Met PT Long Term Goal 2: Pt hand and UE strength to be wnl PT Long Term Goal 2 - Progress: Met Long Term Goal 3: Pt to state she has no numbness in her hand Long Term Goal 3 Progress: Partly met (Still has numbness that comes and goes; no known cause)  Problem List Patient Active Problem List  Diagnosis  . SPONDYLOSIS  . DEGENERATIVE DISC DISEASE, LUMBAR SPINE  . BURSITIS, HIP  . OTHER DYSPHAGIA  . GASTROESOPHAGEAL REFLUX DISEASE, HX OF  . Weakness of right arm  . Numbness and tingling in right hand    PT - End of Session Activity Tolerance: Patient tolerated treatment well General Behavior During Session: [facility] for tasks  performed Cognition: WFL for tasks performed  GP Functional Assessment Tool Used: neck disability Index Functional Limitation: Changing and maintaining body position Changing and Maintaining Body Position Current Status (R6045): At least 1 percent but less than 20 percent impaired, limited or restricted Changing and Maintaining Body Position Goal Status (W0981): At least 1 percent but less than 20 percent impaired, limited or  restricted Changing and Maintaining Body Position Discharge Status [phone]): At least 1 percent but less than 20 percent impaired, limited or restricted  Lurena Nida, PTA/CLT 02/08/2012, 11:08 AM

## 2012-04-07 ENCOUNTER — Other Ambulatory Visit (HOSPITAL_COMMUNITY): Payer: Self-pay | Admitting: Internal Medicine

## 2012-04-07 DIAGNOSIS — Z0000 Encounter for general adult medical examination without abnormal findings: Secondary | ICD-10-CM

## 2012-04-11 ENCOUNTER — Ambulatory Visit (HOSPITAL_COMMUNITY)
Admission: RE | Admit: 2012-04-11 | Discharge: 2012-04-11 | Disposition: A | Discharge status: Home / Self Care | Payer: Medicare Other | Source: Ambulatory Visit | Attending: Internal Medicine | Admitting: Internal Medicine

## 2012-04-11 DIAGNOSIS — Z780 Asymptomatic menopausal state: Secondary | ICD-10-CM | POA: Insufficient documentation

## 2012-04-11 DIAGNOSIS — M899 Disorder of bone, unspecified: Secondary | ICD-10-CM | POA: Insufficient documentation

## 2012-04-11 DIAGNOSIS — Z13820 Encounter for screening for osteoporosis: Secondary | ICD-10-CM | POA: Insufficient documentation

## 2012-04-11 DIAGNOSIS — Z0000 Encounter for general adult medical examination without abnormal findings: Secondary | ICD-10-CM

## 2012-08-30 ENCOUNTER — Other Ambulatory Visit: Payer: Self-pay | Admitting: Adult Health

## 2013-04-08 DIAGNOSIS — R600 Localized edema: Secondary | ICD-10-CM

## 2013-04-08 HISTORY — DX: Localized edema: R60.0

## 2013-04-25 ENCOUNTER — Encounter: Payer: Self-pay | Admitting: Cardiovascular Disease

## 2013-04-25 ENCOUNTER — Ambulatory Visit: Payer: Medicare Other | Admitting: Cardiovascular Disease

## 2013-04-25 ENCOUNTER — Ambulatory Visit (INDEPENDENT_AMBULATORY_CARE_PROVIDER_SITE_OTHER): Payer: Medicare Other | Admitting: Cardiovascular Disease

## 2013-04-25 ENCOUNTER — Ambulatory Visit: Payer: Medicare Other | Admitting: Internal Medicine

## 2013-04-25 VITALS — BP 128/76 | HR 81 | Ht 68.0 in | Wt 174.9 lb

## 2013-04-25 DIAGNOSIS — R609 Edema, unspecified: Secondary | ICD-10-CM

## 2013-04-25 DIAGNOSIS — I10 Essential (primary) hypertension: Secondary | ICD-10-CM | POA: Insufficient documentation

## 2013-04-25 DIAGNOSIS — R600 Localized edema: Secondary | ICD-10-CM | POA: Insufficient documentation

## 2013-04-25 MED ORDER — AMLODIPINE BESYLATE 10 MG PO TABS: 5.0000 mg | ORAL_TABLET | Freq: Every day | ORAL | Status: DC

## 2013-04-25 NOTE — Progress Notes (Signed)
04/25/2013 Linda Williamson   04/08/1934  [id number]  Primary Physician Leonides Grills, MD Primary Cardiologist: Lorretta Harp MD Renae Gloss   HPI:  Linda Williamson is a 79 year old moderately overweight married Caucasian female mother of 55, grandmother to 3 grandchildren accompanied by one of her son-in-laws . She was referred by Dr. Kerin Perna at [facility] in [facility] for evaluation of lower extremity edema.her cardiac risk factor profile is remarkable for tobacco abuse having smoked for 30-60 pack years. She has a family history of heart disease with a sister who had bypass surgery and is currently deceased. She is not diabetic or hyperlipidemic. She's never had a heart for stroke and specifically denies chest pain or shortness of breath. She was diagnosed with hypertension approximately a year and a half ago and placed on amlodipine. Since that time she complained of ankle edema. She does admit to dietary indiscretion with regards to salt.   Current Outpatient Prescriptions  Medication Sig Dispense Refill  . ALPRAZolam (XANAX) 0.5 MG tablet Take 0.5 mg by mouth at bedtime.      Marland Kitchen amLODipine (NORVASC) 10 MG tablet Take 0.5 tablets (5 mg total) by mouth daily.  30 tablet  3  . Calcium Citrate-Vitamin D (CITRACAL + D PO) Take 1 tablet by mouth daily.      . furosemide (LASIX) 40 MG tablet Take 40 mg by mouth 2 (two) times daily.      Marland Kitchen levothyroxine (SYNTHROID, LEVOTHROID) 50 MCG tablet Take 50 mcg by mouth daily.      . naproxen (NAPROSYN) 500 MG tablet Take 500 mg by mouth 2 (two) times daily.      . vitamin E 400 UNIT capsule Take 400 Units by mouth daily.       No current facility-administered medications for this visit.    Allergies  Allergen Reactions  . Penicillins Rash    History   Social History  . Marital Status: Married    Spouse Name: N/A    Number of Children: N/A  . Years of Education: N/A   Occupational History  . Not on  file.   Social History Main Topics  . Smoking status: Current Every Day Smoker -- 0.50 packs/day    Types: Cigarettes  . Smokeless tobacco: Not on file  . Alcohol Use: Yes  . Drug Use: No  . Sexual Activity: Yes    Birth Control/ Protection: None   Other Topics Concern  . Not on file   Social History Narrative  . No narrative on file     Review of Systems: General: negative for chills, fever, night sweats or weight changes.  Cardiovascular: negative for chest pain, dyspnea on exertion, edema, orthopnea, palpitations, paroxysmal nocturnal dyspnea or shortness of breath Dermatological: negative for rash Respiratory: negative for cough or wheezing Urologic: negative for hematuria Abdominal: negative for nausea, vomiting, diarrhea, bright red blood per rectum, melena, or hematemesis Neurologic: negative for visual changes, syncope, or dizziness All other systems reviewed and are otherwise negative except as noted above.    Blood pressure 128/76, pulse 81, height 5\' 8"  (1.727 m), weight 79.334 kg (174 lb 14.4 oz).  General appearance: alert and no distress Neck: no adenopathy, no carotid bruit, no JVD, supple, symmetrical, trachea midline and thyroid not enlarged, symmetric, no tenderness/mass/nodules Lungs: clear to auscultation bilaterally Heart: regular rate and rhythm, S1, S2 normal, no murmur, click, rub or gallop Abdomen: soft, non-tender; bowel sounds normal; no masses,  no  organomegaly Extremities: 1+ ankle edema, 2+ pedal pulses  EKG normal sinus rhythm at 95 without ST or T wave changes  ASSESSMENT AND PLAN:   Essential hypertension Patient was diagnosed with hypertension and giving 2013. This was preceded  by a  Nose bleed.her blood pressure has been under good control since.  Bilateral lower extremity edema Patient was referred by Dr. Gerarda Fraction for lower extremity edema. I suspect this is related to her amlodipine treatment for hypertension. She does admit to dietary  indiscretion. She is on 40 mg of Lasix. She has 1-2+ ankle edema. She denies shortness of breath. I'm going to decrease her amlodipine 5 mg a day we'll check a BMET . I will see her back in 6 weeks. If She still has edema after medication adjustment adjustment and attempt to decrease salt intake change her antihypertensive medication.      Lorretta Harp MD FACP,FACC,FAHA, [facility] 04/25/2013 3:02 PM

## 2013-04-25 NOTE — Patient Instructions (Signed)
  We will see you back in follow up in 6 weeks with Dr Gwenlyn Found  Dr Gwenlyn Found has ordered for you to decrease your amlodipine to 5 mg daily (1/2 table of 10mg ) and have blood work done at [facility].

## 2013-04-25 NOTE — Assessment & Plan Note (Signed)
Patient was diagnosed with hypertension and giving 2013. This was preceded  by a  Nose bleed.her blood pressure has been under good control since.

## 2013-04-25 NOTE — Assessment & Plan Note (Signed)
Patient was referred by Dr. Gerarda Fraction for lower extremity edema. I suspect this is related to her amlodipine treatment for hypertension. She does admit to dietary indiscretion. She is on 40 mg of Lasix. She has 1-2+ ankle edema. She denies shortness of breath. I'm going to decrease her amlodipine 5 mg a day we'll check a BMET . I will see her back in 6 weeks. If She still has edema after medication adjustment adjustment and attempt to decrease salt intake change her antihypertensive medication.

## 2013-04-27 LAB — BASIC METABOLIC PANEL
BUN: 13 mg/dL (ref 6–23)
CALCIUM: 9.7 mg/dL (ref 8.4–10.5)
CO2: 28 meq/L (ref 19–32)
CREATININE: 0.64 mg/dL (ref 0.50–1.10)
Chloride: 103 mEq/L (ref 96–112)
Glucose, Bld: 100 mg/dL — ABNORMAL HIGH (ref 70–99)
Potassium: 3.5 mEq/L (ref 3.5–5.3)
SODIUM: 142 meq/L (ref 135–145)

## 2013-05-01 ENCOUNTER — Encounter: Payer: Self-pay | Admitting: *Deleted

## 2013-05-06 ENCOUNTER — Encounter: Payer: Self-pay | Admitting: *Deleted

## 2013-05-30 ENCOUNTER — Ambulatory Visit: Payer: Medicare Other | Admitting: Cardiovascular Disease

## 2013-07-19 ENCOUNTER — Ambulatory Visit (INDEPENDENT_AMBULATORY_CARE_PROVIDER_SITE_OTHER): Payer: Medicare Other | Admitting: Otolaryngology

## 2013-07-19 DIAGNOSIS — H9319 Tinnitus, unspecified ear: Secondary | ICD-10-CM

## 2013-07-19 DIAGNOSIS — H903 Sensorineural hearing loss, bilateral: Secondary | ICD-10-CM

## 2013-07-19 DIAGNOSIS — R42 Dizziness and giddiness: Secondary | ICD-10-CM

## 2014-07-18 ENCOUNTER — Ambulatory Visit (INDEPENDENT_AMBULATORY_CARE_PROVIDER_SITE_OTHER): Payer: Medicare Other | Admitting: Otolaryngology

## 2014-07-18 DIAGNOSIS — H903 Sensorineural hearing loss, bilateral: Secondary | ICD-10-CM | POA: Diagnosis not present

## 2015-03-24 ENCOUNTER — Other Ambulatory Visit: Payer: Self-pay | Admitting: Oral Surgery

## 2015-03-24 DIAGNOSIS — K091 Developmental (nonodontogenic) cysts of oral region: Secondary | ICD-10-CM | POA: Diagnosis not present

## 2015-03-24 DIAGNOSIS — K116 Mucocele of salivary gland: Secondary | ICD-10-CM | POA: Diagnosis not present

## 2015-05-06 DIAGNOSIS — E663 Overweight: Secondary | ICD-10-CM | POA: Diagnosis not present

## 2015-05-06 DIAGNOSIS — Z1389 Encounter for screening for other disorder: Secondary | ICD-10-CM | POA: Diagnosis not present

## 2015-05-06 DIAGNOSIS — M65811 Other synovitis and tenosynovitis, right shoulder: Secondary | ICD-10-CM | POA: Diagnosis not present

## 2015-05-06 DIAGNOSIS — Z6825 Body mass index (BMI) 25.0-25.9, adult: Secondary | ICD-10-CM | POA: Diagnosis not present

## 2015-05-06 DIAGNOSIS — M719 Bursopathy, unspecified: Secondary | ICD-10-CM | POA: Diagnosis not present

## 2015-05-08 ENCOUNTER — Other Ambulatory Visit (HOSPITAL_COMMUNITY): Payer: Self-pay | Admitting: Physician Assistant

## 2015-05-08 DIAGNOSIS — M810 Age-related osteoporosis without current pathological fracture: Secondary | ICD-10-CM

## 2015-05-12 ENCOUNTER — Ambulatory Visit (HOSPITAL_COMMUNITY)
Admission: RE | Admit: 2015-05-12 | Discharge: 2015-05-12 | Disposition: A | Discharge status: Home / Self Care | Payer: PPO | Source: Ambulatory Visit | Attending: Physician Assistant | Admitting: Physician Assistant

## 2015-05-12 DIAGNOSIS — M85851 Other specified disorders of bone density and structure, right thigh: Secondary | ICD-10-CM | POA: Diagnosis not present

## 2015-05-12 DIAGNOSIS — Z1389 Encounter for screening for other disorder: Secondary | ICD-10-CM | POA: Insufficient documentation

## 2015-05-12 DIAGNOSIS — Z79899 Other long term (current) drug therapy: Secondary | ICD-10-CM | POA: Diagnosis not present

## 2015-05-12 DIAGNOSIS — M810 Age-related osteoporosis without current pathological fracture: Secondary | ICD-10-CM | POA: Diagnosis not present

## 2015-05-12 DIAGNOSIS — M85832 Other specified disorders of bone density and structure, left forearm: Secondary | ICD-10-CM | POA: Insufficient documentation

## 2015-07-24 ENCOUNTER — Ambulatory Visit (INDEPENDENT_AMBULATORY_CARE_PROVIDER_SITE_OTHER): Payer: PPO | Admitting: Otolaryngology

## 2015-07-24 DIAGNOSIS — H903 Sensorineural hearing loss, bilateral: Secondary | ICD-10-CM | POA: Diagnosis not present

## 2015-07-24 DIAGNOSIS — Z1389 Encounter for screening for other disorder: Secondary | ICD-10-CM | POA: Diagnosis not present

## 2015-07-24 DIAGNOSIS — G4700 Insomnia, unspecified: Secondary | ICD-10-CM | POA: Diagnosis not present

## 2015-07-24 DIAGNOSIS — Z681 Body mass index (BMI) 19 or less, adult: Secondary | ICD-10-CM | POA: Diagnosis not present

## 2015-07-24 DIAGNOSIS — R600 Localized edema: Secondary | ICD-10-CM | POA: Diagnosis not present

## 2015-08-07 DIAGNOSIS — M25511 Pain in right shoulder: Secondary | ICD-10-CM | POA: Diagnosis not present

## 2015-08-07 DIAGNOSIS — F419 Anxiety disorder, unspecified: Secondary | ICD-10-CM | POA: Diagnosis not present

## 2015-08-07 DIAGNOSIS — Z6825 Body mass index (BMI) 25.0-25.9, adult: Secondary | ICD-10-CM | POA: Diagnosis not present

## 2015-08-07 DIAGNOSIS — Z1389 Encounter for screening for other disorder: Secondary | ICD-10-CM | POA: Diagnosis not present

## 2015-08-13 DIAGNOSIS — Z6825 Body mass index (BMI) 25.0-25.9, adult: Secondary | ICD-10-CM | POA: Diagnosis not present

## 2015-08-13 DIAGNOSIS — M65811 Other synovitis and tenosynovitis, right shoulder: Secondary | ICD-10-CM | POA: Diagnosis not present

## 2015-08-18 DIAGNOSIS — M65811 Other synovitis and tenosynovitis, right shoulder: Secondary | ICD-10-CM | POA: Diagnosis not present

## 2015-08-18 DIAGNOSIS — Z6825 Body mass index (BMI) 25.0-25.9, adult: Secondary | ICD-10-CM | POA: Diagnosis not present

## 2015-09-02 DIAGNOSIS — M75121 Complete rotator cuff tear or rupture of right shoulder, not specified as traumatic: Secondary | ICD-10-CM | POA: Diagnosis not present

## 2015-09-02 DIAGNOSIS — M4722 Other spondylosis with radiculopathy, cervical region: Secondary | ICD-10-CM | POA: Diagnosis not present

## 2015-09-11 DIAGNOSIS — E663 Overweight: Secondary | ICD-10-CM | POA: Diagnosis not present

## 2015-09-11 DIAGNOSIS — Z6825 Body mass index (BMI) 25.0-25.9, adult: Secondary | ICD-10-CM | POA: Diagnosis not present

## 2015-09-11 DIAGNOSIS — M65811 Other synovitis and tenosynovitis, right shoulder: Secondary | ICD-10-CM | POA: Diagnosis not present

## 2015-09-25 DIAGNOSIS — H26491 Other secondary cataract, right eye: Secondary | ICD-10-CM | POA: Diagnosis not present

## 2015-09-25 DIAGNOSIS — H43813 Vitreous degeneration, bilateral: Secondary | ICD-10-CM | POA: Diagnosis not present

## 2015-09-25 DIAGNOSIS — H52203 Unspecified astigmatism, bilateral: Secondary | ICD-10-CM | POA: Diagnosis not present

## 2015-09-25 DIAGNOSIS — Z961 Presence of intraocular lens: Secondary | ICD-10-CM | POA: Diagnosis not present

## 2015-10-08 DIAGNOSIS — E663 Overweight: Secondary | ICD-10-CM | POA: Diagnosis not present

## 2015-10-08 DIAGNOSIS — G629 Polyneuropathy, unspecified: Secondary | ICD-10-CM | POA: Diagnosis not present

## 2015-10-08 DIAGNOSIS — K5900 Constipation, unspecified: Secondary | ICD-10-CM | POA: Diagnosis not present

## 2015-10-08 DIAGNOSIS — Z1389 Encounter for screening for other disorder: Secondary | ICD-10-CM | POA: Diagnosis not present

## 2015-10-08 DIAGNOSIS — R200 Anesthesia of skin: Secondary | ICD-10-CM | POA: Diagnosis not present

## 2015-10-08 DIAGNOSIS — Z6825 Body mass index (BMI) 25.0-25.9, adult: Secondary | ICD-10-CM | POA: Diagnosis not present

## 2015-10-08 DIAGNOSIS — R5383 Other fatigue: Secondary | ICD-10-CM | POA: Diagnosis not present

## 2015-11-11 DIAGNOSIS — M75121 Complete rotator cuff tear or rupture of right shoulder, not specified as traumatic: Secondary | ICD-10-CM | POA: Diagnosis not present

## 2015-11-11 DIAGNOSIS — M4722 Other spondylosis with radiculopathy, cervical region: Secondary | ICD-10-CM | POA: Diagnosis not present

## 2015-11-19 ENCOUNTER — Other Ambulatory Visit: Payer: Self-pay | Admitting: *Deleted

## 2015-11-19 ENCOUNTER — Other Ambulatory Visit (HOSPITAL_COMMUNITY): Payer: Self-pay | Admitting: Orthopedic Surgery

## 2015-11-19 DIAGNOSIS — M25511 Pain in right shoulder: Secondary | ICD-10-CM

## 2015-11-26 ENCOUNTER — Ambulatory Visit (HOSPITAL_COMMUNITY)
Admission: RE | Admit: 2015-11-26 | Discharge: 2015-11-26 | Disposition: A | Discharge status: Home / Self Care | Payer: PPO | Source: Ambulatory Visit | Attending: Orthopedic Surgery | Admitting: Orthopedic Surgery

## 2015-11-26 DIAGNOSIS — M7521 Bicipital tendinitis, right shoulder: Secondary | ICD-10-CM | POA: Insufficient documentation

## 2015-11-26 DIAGNOSIS — M25511 Pain in right shoulder: Secondary | ICD-10-CM | POA: Diagnosis not present

## 2015-11-26 DIAGNOSIS — S46011A Strain of muscle(s) and tendon(s) of the rotator cuff of right shoulder, initial encounter: Secondary | ICD-10-CM | POA: Diagnosis not present

## 2015-11-26 DIAGNOSIS — M75121 Complete rotator cuff tear or rupture of right shoulder, not specified as traumatic: Secondary | ICD-10-CM | POA: Insufficient documentation

## 2015-12-02 DIAGNOSIS — M75121 Complete rotator cuff tear or rupture of right shoulder, not specified as traumatic: Secondary | ICD-10-CM | POA: Diagnosis not present

## 2015-12-02 DIAGNOSIS — M4722 Other spondylosis with radiculopathy, cervical region: Secondary | ICD-10-CM | POA: Diagnosis not present

## 2015-12-23 DIAGNOSIS — Z23 Encounter for immunization: Secondary | ICD-10-CM | POA: Diagnosis not present

## 2015-12-26 DIAGNOSIS — H10413 Chronic giant papillary conjunctivitis, bilateral: Secondary | ICD-10-CM | POA: Diagnosis not present

## 2015-12-30 DIAGNOSIS — M4722 Other spondylosis with radiculopathy, cervical region: Secondary | ICD-10-CM | POA: Diagnosis not present

## 2015-12-30 DIAGNOSIS — M75121 Complete rotator cuff tear or rupture of right shoulder, not specified as traumatic: Secondary | ICD-10-CM | POA: Diagnosis not present

## 2015-12-31 DIAGNOSIS — E663 Overweight: Secondary | ICD-10-CM | POA: Diagnosis not present

## 2015-12-31 DIAGNOSIS — Z6825 Body mass index (BMI) 25.0-25.9, adult: Secondary | ICD-10-CM | POA: Diagnosis not present

## 2015-12-31 DIAGNOSIS — L309 Dermatitis, unspecified: Secondary | ICD-10-CM | POA: Diagnosis not present

## 2016-01-06 DIAGNOSIS — L718 Other rosacea: Secondary | ICD-10-CM | POA: Diagnosis not present

## 2016-01-06 DIAGNOSIS — L650 Telogen effluvium: Secondary | ICD-10-CM | POA: Diagnosis not present

## 2016-01-06 DIAGNOSIS — L820 Inflamed seborrheic keratosis: Secondary | ICD-10-CM | POA: Diagnosis not present

## 2016-01-06 DIAGNOSIS — L728 Other follicular cysts of the skin and subcutaneous tissue: Secondary | ICD-10-CM | POA: Diagnosis not present

## 2016-01-15 DIAGNOSIS — L218 Other seborrheic dermatitis: Secondary | ICD-10-CM | POA: Diagnosis not present

## 2016-02-09 ENCOUNTER — Ambulatory Visit (INDEPENDENT_AMBULATORY_CARE_PROVIDER_SITE_OTHER): Payer: PPO | Admitting: Otolaryngology

## 2016-02-09 DIAGNOSIS — J342 Deviated nasal septum: Secondary | ICD-10-CM

## 2016-02-09 DIAGNOSIS — J343 Hypertrophy of nasal turbinates: Secondary | ICD-10-CM | POA: Diagnosis not present

## 2016-02-09 DIAGNOSIS — H608X3 Other otitis externa, bilateral: Secondary | ICD-10-CM

## 2016-02-09 DIAGNOSIS — H6983 Other specified disorders of Eustachian tube, bilateral: Secondary | ICD-10-CM

## 2016-02-09 DIAGNOSIS — J310 Chronic rhinitis: Secondary | ICD-10-CM

## 2016-03-22 ENCOUNTER — Ambulatory Visit (INDEPENDENT_AMBULATORY_CARE_PROVIDER_SITE_OTHER): Payer: PPO | Admitting: Otolaryngology

## 2016-03-22 DIAGNOSIS — H608X3 Other otitis externa, bilateral: Secondary | ICD-10-CM | POA: Diagnosis not present

## 2016-03-22 DIAGNOSIS — J310 Chronic rhinitis: Secondary | ICD-10-CM

## 2016-05-03 DIAGNOSIS — M7740 Metatarsalgia, unspecified foot: Secondary | ICD-10-CM | POA: Diagnosis not present

## 2016-05-03 DIAGNOSIS — L909 Atrophic disorder of skin, unspecified: Secondary | ICD-10-CM | POA: Diagnosis not present

## 2016-05-03 DIAGNOSIS — M79673 Pain in unspecified foot: Secondary | ICD-10-CM | POA: Diagnosis not present

## 2016-05-25 DIAGNOSIS — M75121 Complete rotator cuff tear or rupture of right shoulder, not specified as traumatic: Secondary | ICD-10-CM | POA: Diagnosis not present

## 2016-05-31 ENCOUNTER — Ambulatory Visit (INDEPENDENT_AMBULATORY_CARE_PROVIDER_SITE_OTHER): Payer: PPO

## 2016-05-31 ENCOUNTER — Encounter: Payer: Self-pay | Admitting: Podiatry

## 2016-05-31 ENCOUNTER — Ambulatory Visit (INDEPENDENT_AMBULATORY_CARE_PROVIDER_SITE_OTHER): Payer: PPO | Admitting: Podiatry

## 2016-05-31 VITALS — BP 146/80 | HR 71 | Resp 16

## 2016-05-31 DIAGNOSIS — M722 Plantar fascial fibromatosis: Secondary | ICD-10-CM | POA: Diagnosis not present

## 2016-05-31 DIAGNOSIS — M79674 Pain in right toe(s): Secondary | ICD-10-CM

## 2016-05-31 DIAGNOSIS — R52 Pain, unspecified: Secondary | ICD-10-CM | POA: Diagnosis not present

## 2016-05-31 MED ORDER — TRIAMCINOLONE ACETONIDE 10 MG/ML IJ SUSP: 10.0000 mg | Freq: Once | INTRAMUSCULAR | Status: DC

## 2016-06-01 NOTE — Progress Notes (Signed)
Subjective:     Patient ID: Linda Williamson, female   DOB: [birth year], 82 y.o.   MRN: [id number]  HPI patient states my feet tender burn both feet but it seems like it's worse in the heels. Patient does not remember specific injury that may have occur   Review of Systems  All other systems reviewed and are negative.      Objective:   Physical Exam  Constitutional: She is oriented to person, place, and time.  Cardiovascular: Intact distal pulses.   Musculoskeletal: Normal range of motion.  Neurological: She is oriented to person, place, and time.  Skin: Skin is warm.  Nursing note and vitals reviewed.  neurovascular status intact muscle strength adequate range of motion within normal limits with patient found to have discomfort with inflammation of the plantar heel region bilateral with fluid buildup and moderate change in the rest of the foot and also mild to moderate neuropathic symptoms with diminished sharp Dole vibratory     Assessment:     May be several conditions but what appears to be plantar fasciitis bilateral as an acute condition    Plan:     H&P condition reviewed and today were to focus on the heels and see what kind of response we can get. I injected the plantar fascial bilateral 3 mg Kenalog 5 mg Xylocaine and fascial brace with instructions on usage. Reappoint in the next several weeks for reevaluation  X-ray indicates spur formation with mild osteoporosis and no indication of stress fracture

## 2016-06-14 ENCOUNTER — Encounter: Payer: Self-pay | Admitting: Podiatry

## 2016-06-14 ENCOUNTER — Ambulatory Visit (INDEPENDENT_AMBULATORY_CARE_PROVIDER_SITE_OTHER): Payer: PPO | Admitting: Podiatry

## 2016-06-14 DIAGNOSIS — B351 Tinea unguium: Secondary | ICD-10-CM

## 2016-06-14 DIAGNOSIS — M722 Plantar fascial fibromatosis: Secondary | ICD-10-CM

## 2016-06-14 NOTE — Progress Notes (Signed)
Subjective:     Patient ID: Linda Williamson, female   DOB: 03/15/1934, 82 y.o.   MRN: [id number]  HPI patient presents stating that her heels are improving and her nails are bothering her and she like to have them cut by [place]   Review of Systems     Objective:   Physical Exam Neurovascular status intact with significant diminishment of discomfort in the bilateral plantar heels with nail disease 1-5 both feet that are thick but not painful    Assessment:     Plantar fasciitis improved bilateral with patient noted to have nail disease 1-5 both feet    Plan:     H&P and both conditions discussed. Continue bracing for the heels with gradual return to normal activities and debridement of nailbeds 1-5 accomplished today with no iatrogenic bleeding noted

## 2016-07-30 DIAGNOSIS — Z1389 Encounter for screening for other disorder: Secondary | ICD-10-CM | POA: Diagnosis not present

## 2016-07-30 DIAGNOSIS — Z6825 Body mass index (BMI) 25.0-25.9, adult: Secondary | ICD-10-CM | POA: Diagnosis not present

## 2016-07-30 DIAGNOSIS — G4700 Insomnia, unspecified: Secondary | ICD-10-CM | POA: Diagnosis not present

## 2016-08-19 DIAGNOSIS — Z6824 Body mass index (BMI) 24.0-24.9, adult: Secondary | ICD-10-CM | POA: Diagnosis not present

## 2016-08-19 DIAGNOSIS — J069 Acute upper respiratory infection, unspecified: Secondary | ICD-10-CM | POA: Diagnosis not present

## 2016-08-19 DIAGNOSIS — F17290 Nicotine dependence, other tobacco product, uncomplicated: Secondary | ICD-10-CM | POA: Diagnosis not present

## 2016-08-19 DIAGNOSIS — Z1389 Encounter for screening for other disorder: Secondary | ICD-10-CM | POA: Diagnosis not present

## 2016-08-19 DIAGNOSIS — J449 Chronic obstructive pulmonary disease, unspecified: Secondary | ICD-10-CM | POA: Diagnosis not present

## 2016-08-19 DIAGNOSIS — Z719 Counseling, unspecified: Secondary | ICD-10-CM | POA: Diagnosis not present

## 2016-08-19 DIAGNOSIS — J209 Acute bronchitis, unspecified: Secondary | ICD-10-CM | POA: Diagnosis not present

## 2016-08-27 DIAGNOSIS — R5383 Other fatigue: Secondary | ICD-10-CM | POA: Diagnosis not present

## 2016-08-27 DIAGNOSIS — Z79899 Other long term (current) drug therapy: Secondary | ICD-10-CM | POA: Diagnosis not present

## 2016-08-27 DIAGNOSIS — R600 Localized edema: Secondary | ICD-10-CM | POA: Diagnosis not present

## 2016-08-28 DIAGNOSIS — E119 Type 2 diabetes mellitus without complications: Secondary | ICD-10-CM | POA: Diagnosis not present

## 2016-08-28 DIAGNOSIS — R5383 Other fatigue: Secondary | ICD-10-CM | POA: Diagnosis not present

## 2016-10-23 ENCOUNTER — Emergency Department (HOSPITAL_COMMUNITY)
Admission: EM | Admit: 2016-10-23 | Discharge: 2016-10-23 | Disposition: A | Discharge status: Home Health | Payer: PPO | Attending: Emergency Medicine | Admitting: Emergency Medicine

## 2016-10-23 ENCOUNTER — Encounter (HOSPITAL_COMMUNITY): Payer: Self-pay | Admitting: *Deleted

## 2016-10-23 DIAGNOSIS — R1310 Dysphagia, unspecified: Secondary | ICD-10-CM | POA: Diagnosis not present

## 2016-10-23 DIAGNOSIS — Z85819 Personal history of malignant neoplasm of unspecified site of lip, oral cavity, and pharynx: Secondary | ICD-10-CM | POA: Diagnosis not present

## 2016-10-23 DIAGNOSIS — F17210 Nicotine dependence, cigarettes, uncomplicated: Secondary | ICD-10-CM | POA: Diagnosis not present

## 2016-10-23 DIAGNOSIS — J3489 Other specified disorders of nose and nasal sinuses: Secondary | ICD-10-CM | POA: Diagnosis not present

## 2016-10-23 DIAGNOSIS — J029 Acute pharyngitis, unspecified: Secondary | ICD-10-CM | POA: Diagnosis not present

## 2016-10-23 DIAGNOSIS — Z79899 Other long term (current) drug therapy: Secondary | ICD-10-CM | POA: Insufficient documentation

## 2016-10-23 DIAGNOSIS — I10 Essential (primary) hypertension: Secondary | ICD-10-CM | POA: Diagnosis not present

## 2016-10-23 DIAGNOSIS — E039 Hypothyroidism, unspecified: Secondary | ICD-10-CM | POA: Insufficient documentation

## 2016-10-23 DIAGNOSIS — R05 Cough: Secondary | ICD-10-CM | POA: Insufficient documentation

## 2016-10-23 LAB — CBG MONITORING, ED: GLUCOSE-CAPILLARY: 87 mg/dL (ref 65–99)

## 2016-10-23 LAB — RAPID STREP SCREEN (MED CTR MEBANE ONLY): Streptococcus, Group A Screen (Direct): NEGATIVE

## 2016-10-23 NOTE — ED Provider Notes (Signed)
[facility] DEPT Provider Note   CSN: [id number] Arrival date & time: 10/23/16  1503     History   Chief Complaint Chief Complaint  Patient presents with  . Sore Throat    HPI Linda Williamson is a 82 y.o. female with a history hypothyroidism, htn and distant history of throat cancer presenting with a one day history of right sided sore throat which started gradually yesterday evening.  She has a history of mild dysphagia present since her throat surgery (more than 10 years ago) and denies any current foreign body sensation. She reports a chronic dry throat despite drinking lots of fluids. She has noticed increased sneezing, particularly when outdoors.  She has had some chronic nasal drainage for which she took zyrtec this am with no significant relief.  She also was treated for an acute bronchitis last month using a prednisone taper and an albuterol mdi.  She continues to have the cough with clear sputum, started taking robitussin for this today. She denies sob, fevers, chills and chest pain.  She is a 1/2 ppd smoker.  The history is provided by the patient and a relative.    Past Medical History:  Diagnosis Date  . Bilateral lower extremity edema   . Cancer ([place])   . Cataract   . Hypertension   . Thyroid disease     Patient Active Problem List   Diagnosis Date Noted  . Essential hypertension 04/25/2013  . Bilateral lower extremity edema 04/25/2013  . Weakness of right arm 01/05/2012  . Numbness and tingling in right hand 01/05/2012  . OTHER DYSPHAGIA 12/09/2009  . GASTROESOPHAGEAL REFLUX DISEASE, HX OF 12/05/2009  . SPONDYLOSIS 10/17/2008  . [place] DISEASE, LUMBAR SPINE 10/17/2008  . BURSITIS, HIP 10/17/2008    Past Surgical History:  Procedure Laterality Date  . ABDOMINAL HYSTERECTOMY    . APPENDECTOMY    . CATARACT EXTRACTION    . CHOLECYSTECTOMY    . THROAT SURGERY      OB History    No data available       Home Medications    Prior to  Admission medications   Medication Sig Start Date End Date Taking? Authorizing Provider  ALPRAZolam Duanne Moron) 0.5 MG tablet Take 0.5 mg by mouth at bedtime.    [provider]  amLODipine (NORVASC) 10 MG tablet Take 0.5 tablets (5 mg total) by mouth daily. 04/25/13   Pixie Casino, MD  amLODipine (NORVASC) 5 MG tablet  05/04/16   [provider]  Calcium Citrate-Vitamin D (CITRACAL + D PO) Take 1 tablet by mouth daily.    [provider]  furosemide (LASIX) 40 MG tablet Take 40 mg by mouth 2 (two) times daily.    [provider]  levothyroxine (SYNTHROID, LEVOTHROID) 50 MCG tablet Take 50 mcg by mouth daily.    [provider]  naproxen (NAPROSYN) 500 MG tablet Take 500 mg by mouth 2 (two) times daily.    [provider]  traMADol (ULTRAM) 50 MG tablet Take by mouth every 6 (six) hours as needed.    [provider]  vitamin E 400 UNIT capsule Take 400 Units by mouth daily.    [provider]    Family History Family History  Problem Relation Age of Onset  . Heart failure Mother   . Cancer Father        Bladder  . COPD Brother   . Heart disease Sister   . COPD Brother  Social History Social History  Substance Use Topics  . Smoking status: Current Every Day Smoker    Packs/day: 0.50    Types: Cigarettes  . Smokeless tobacco: Never Used  . Alcohol use Yes     Comment: glass of wine occasionally      Allergies   Penicillins   Review of Systems Review of Systems  Constitutional: Negative for fever.  HENT: Positive for rhinorrhea and sore throat. Negative for congestion, postnasal drip, sinus pain, sinus pressure and voice change.   Eyes: Negative.   Respiratory: Positive for cough. Negative for chest tightness, shortness of breath, wheezing and stridor.   Cardiovascular: Negative for chest pain.  Gastrointestinal: Negative for abdominal pain and nausea.  Genitourinary: Negative.   Musculoskeletal:  Negative for arthralgias, joint swelling and neck pain.  Skin: Negative.  Negative for rash and wound.  Neurological: Negative for dizziness, weakness, light-headedness, numbness and headaches.  Psychiatric/Behavioral: Negative.      Physical Exam Updated Vital Signs BP 140/89   Pulse 95   Temp 98.5 F (36.9 C) (Oral)   Resp 16   Ht 5\' 9"  (1.753 m)   Wt 75.8 kg (167 lb)   SpO2 95%   BMI 24.66 kg/m   Physical Exam  Constitutional: She is oriented to person, place, and time. She appears well-developed and well-nourished.  HENT:  Head: Normocephalic and atraumatic.  Right Ear: Tympanic membrane and ear canal normal.  Left Ear: Tympanic membrane and ear canal normal.  Nose: Rhinorrhea present. No mucosal edema.  Mouth/Throat: Uvula is midline and mucous membranes are normal. Posterior oropharyngeal erythema present. No oropharyngeal exudate, posterior oropharyngeal edema or tonsillar abscesses.  Minimal tonsillar tissue present.  Posterior pharyx is patent, symmetric. Erythema noted along posterior soft palate.  No PND.   Eyes: Conjunctivae are normal.  Neck: No tracheal deviation present. No thyromegaly present.  Cardiovascular: Normal rate and normal heart sounds.   Pulmonary/Chest: Effort normal and breath sounds normal. No stridor. No respiratory distress. She has no wheezes. She has no rales.  Abdominal: Soft. There is no tenderness.  Musculoskeletal: Normal range of motion.  Lymphadenopathy:    She has no cervical adenopathy.  Neurological: She is alert and oriented to person, place, and time.  Skin: Skin is warm and dry. No rash noted.  Psychiatric: She has a normal mood and affect.     ED Treatments / Results  Labs (all labs ordered are listed, but only abnormal results are displayed) Labs Reviewed  RAPID STREP SCREEN (NOT AT [facility])  CULTURE, GROUP A STREP ([place])  CBG MONITORING, ED    EKG  EKG Interpretation None       Radiology No results  found.  Procedures Procedures (including critical care time)  Medications Ordered in ED Medications - No data to display   Initial Impression / Assessment and Plan / ED Course  I have reviewed the triage vital signs and the nursing notes.  Pertinent labs & imaging results that were available during my care of the patient were reviewed by me and considered in my medical decision making (see chart for details).     Pt with possible viral  uri but favor allergy related sx with post nasal drip causing sore throat.  No exam findings to suggest throat mass. Uvula midline, throat widely patent. Encouraged to continue taking zyrtec,  Increase fluid intake, cepacol drops, salt water gargles, sx care.  She has appt with her pcp in 6 days for thyroid check. Encouraged her  to keep this appt.  Final Clinical Impressions(s) / ED Diagnoses   Final diagnoses:  Acute pharyngitis, unspecified etiology    New Prescriptions Discharge Medication List as of 10/23/2016  5:12 PM       Evalee Jefferson, PA-C 10/23/16 1724    Noemi Chapel, MD 10/24/16 1505

## 2016-10-23 NOTE — Discharge Instructions (Signed)
Your strep test and lab work is normal today.  I suspect your sore throat is either irritation from allergy or post nasal drip (since your nose has been running) this can cause sore throat, or from a virus which should run its course.  I recommend you try cough drop lozenges. Cepacol is particularly good at helping with sore throat pain.  Warm tea, a teaspoon of honey, warm salt water gargles can also sooth a sore throat.  You may continue taking your zyrtec for your nasal drainage but remember this medicine can make your throat dry and increasing fluid intake will be helpful.

## 2016-10-23 NOTE — ED Triage Notes (Addendum)
Pt c/o sore throat since yesterday. Pt denies fever. Pt reports chronic cough, no change.

## 2016-10-23 NOTE — ED Triage Notes (Signed)
Pt reports ST and increased thirst- has been helping clean out her brother's home.   Dr Glennon Mac is PCP

## 2016-10-26 LAB — CULTURE, GROUP A STREP (THRC)

## 2016-10-29 DIAGNOSIS — E663 Overweight: Secondary | ICD-10-CM | POA: Diagnosis not present

## 2016-10-29 DIAGNOSIS — Z6825 Body mass index (BMI) 25.0-25.9, adult: Secondary | ICD-10-CM | POA: Diagnosis not present

## 2016-10-29 DIAGNOSIS — F419 Anxiety disorder, unspecified: Secondary | ICD-10-CM | POA: Diagnosis not present

## 2016-10-29 DIAGNOSIS — Z1389 Encounter for screening for other disorder: Secondary | ICD-10-CM | POA: Diagnosis not present

## 2016-12-23 DIAGNOSIS — F419 Anxiety disorder, unspecified: Secondary | ICD-10-CM | POA: Diagnosis not present

## 2016-12-23 DIAGNOSIS — Z6825 Body mass index (BMI) 25.0-25.9, adult: Secondary | ICD-10-CM | POA: Diagnosis not present

## 2016-12-23 DIAGNOSIS — Z23 Encounter for immunization: Secondary | ICD-10-CM | POA: Diagnosis not present

## 2016-12-23 DIAGNOSIS — E663 Overweight: Secondary | ICD-10-CM | POA: Diagnosis not present

## 2016-12-23 DIAGNOSIS — L22 Diaper dermatitis: Secondary | ICD-10-CM | POA: Diagnosis not present

## 2017-02-01 DIAGNOSIS — M75121 Complete rotator cuff tear or rupture of right shoulder, not specified as traumatic: Secondary | ICD-10-CM | POA: Diagnosis not present

## 2017-05-19 DIAGNOSIS — Z1389 Encounter for screening for other disorder: Secondary | ICD-10-CM | POA: Diagnosis not present

## 2017-05-19 DIAGNOSIS — F419 Anxiety disorder, unspecified: Secondary | ICD-10-CM | POA: Diagnosis not present

## 2017-05-19 DIAGNOSIS — E063 Autoimmune thyroiditis: Secondary | ICD-10-CM | POA: Diagnosis not present

## 2017-05-19 DIAGNOSIS — Z6826 Body mass index (BMI) 26.0-26.9, adult: Secondary | ICD-10-CM | POA: Diagnosis not present

## 2017-05-19 DIAGNOSIS — I10 Essential (primary) hypertension: Secondary | ICD-10-CM | POA: Diagnosis not present

## 2017-05-19 DIAGNOSIS — E663 Overweight: Secondary | ICD-10-CM | POA: Diagnosis not present

## 2017-08-11 DIAGNOSIS — J441 Chronic obstructive pulmonary disease with (acute) exacerbation: Secondary | ICD-10-CM | POA: Diagnosis not present

## 2017-08-11 DIAGNOSIS — Z6825 Body mass index (BMI) 25.0-25.9, adult: Secondary | ICD-10-CM | POA: Diagnosis not present

## 2017-08-11 DIAGNOSIS — E663 Overweight: Secondary | ICD-10-CM | POA: Diagnosis not present

## 2017-09-02 DIAGNOSIS — E782 Mixed hyperlipidemia: Secondary | ICD-10-CM | POA: Diagnosis not present

## 2017-09-02 DIAGNOSIS — Z6824 Body mass index (BMI) 24.0-24.9, adult: Secondary | ICD-10-CM | POA: Diagnosis not present

## 2017-09-02 DIAGNOSIS — F419 Anxiety disorder, unspecified: Secondary | ICD-10-CM | POA: Diagnosis not present

## 2017-09-02 DIAGNOSIS — Z0001 Encounter for general adult medical examination with abnormal findings: Secondary | ICD-10-CM | POA: Diagnosis not present

## 2017-09-02 DIAGNOSIS — Z1389 Encounter for screening for other disorder: Secondary | ICD-10-CM | POA: Diagnosis not present

## 2017-09-02 DIAGNOSIS — I10 Essential (primary) hypertension: Secondary | ICD-10-CM | POA: Diagnosis not present

## 2017-09-02 DIAGNOSIS — E063 Autoimmune thyroiditis: Secondary | ICD-10-CM | POA: Diagnosis not present

## 2017-10-24 DIAGNOSIS — G629 Polyneuropathy, unspecified: Secondary | ICD-10-CM | POA: Diagnosis not present

## 2017-10-24 DIAGNOSIS — L74513 Primary focal hyperhidrosis, soles: Secondary | ICD-10-CM | POA: Diagnosis not present

## 2017-12-05 DIAGNOSIS — Z23 Encounter for immunization: Secondary | ICD-10-CM | POA: Diagnosis not present

## 2017-12-26 DIAGNOSIS — M7740 Metatarsalgia, unspecified foot: Secondary | ICD-10-CM | POA: Diagnosis not present

## 2017-12-26 DIAGNOSIS — M79671 Pain in right foot: Secondary | ICD-10-CM | POA: Diagnosis not present

## 2018-01-06 DIAGNOSIS — E663 Overweight: Secondary | ICD-10-CM | POA: Diagnosis not present

## 2018-01-06 DIAGNOSIS — Z6825 Body mass index (BMI) 25.0-25.9, adult: Secondary | ICD-10-CM | POA: Diagnosis not present

## 2018-01-06 DIAGNOSIS — S29012A Strain of muscle and tendon of back wall of thorax, initial encounter: Secondary | ICD-10-CM | POA: Diagnosis not present

## 2018-01-23 DIAGNOSIS — Z6826 Body mass index (BMI) 26.0-26.9, adult: Secondary | ICD-10-CM | POA: Diagnosis not present

## 2018-01-23 DIAGNOSIS — M546 Pain in thoracic spine: Secondary | ICD-10-CM | POA: Diagnosis not present

## 2018-01-23 DIAGNOSIS — M7918 Myalgia, other site: Secondary | ICD-10-CM | POA: Diagnosis not present

## 2018-01-23 DIAGNOSIS — E663 Overweight: Secondary | ICD-10-CM | POA: Diagnosis not present

## 2018-02-09 DIAGNOSIS — Z6825 Body mass index (BMI) 25.0-25.9, adult: Secondary | ICD-10-CM | POA: Diagnosis not present

## 2018-02-09 DIAGNOSIS — E663 Overweight: Secondary | ICD-10-CM | POA: Diagnosis not present

## 2018-02-09 DIAGNOSIS — I10 Essential (primary) hypertension: Secondary | ICD-10-CM | POA: Diagnosis not present

## 2018-03-31 DIAGNOSIS — E063 Autoimmune thyroiditis: Secondary | ICD-10-CM | POA: Diagnosis not present

## 2018-03-31 DIAGNOSIS — R7309 Other abnormal glucose: Secondary | ICD-10-CM | POA: Diagnosis not present

## 2018-03-31 DIAGNOSIS — Z719 Counseling, unspecified: Secondary | ICD-10-CM | POA: Diagnosis not present

## 2018-03-31 DIAGNOSIS — E7849 Other hyperlipidemia: Secondary | ICD-10-CM | POA: Diagnosis not present

## 2018-03-31 DIAGNOSIS — F419 Anxiety disorder, unspecified: Secondary | ICD-10-CM | POA: Diagnosis not present

## 2018-03-31 DIAGNOSIS — I10 Essential (primary) hypertension: Secondary | ICD-10-CM | POA: Diagnosis not present

## 2018-03-31 DIAGNOSIS — F330 Major depressive disorder, recurrent, mild: Secondary | ICD-10-CM | POA: Diagnosis not present

## 2018-03-31 DIAGNOSIS — Z6826 Body mass index (BMI) 26.0-26.9, adult: Secondary | ICD-10-CM | POA: Diagnosis not present

## 2018-09-01 DIAGNOSIS — Z6825 Body mass index (BMI) 25.0-25.9, adult: Secondary | ICD-10-CM | POA: Diagnosis not present

## 2018-09-01 DIAGNOSIS — I10 Essential (primary) hypertension: Secondary | ICD-10-CM | POA: Diagnosis not present

## 2018-09-01 DIAGNOSIS — E063 Autoimmune thyroiditis: Secondary | ICD-10-CM | POA: Diagnosis not present

## 2018-09-01 DIAGNOSIS — R7309 Other abnormal glucose: Secondary | ICD-10-CM | POA: Diagnosis not present

## 2018-09-01 DIAGNOSIS — E663 Overweight: Secondary | ICD-10-CM | POA: Diagnosis not present

## 2018-09-01 DIAGNOSIS — M1990 Unspecified osteoarthritis, unspecified site: Secondary | ICD-10-CM | POA: Diagnosis not present

## 2018-09-01 DIAGNOSIS — F419 Anxiety disorder, unspecified: Secondary | ICD-10-CM | POA: Diagnosis not present

## 2018-09-01 DIAGNOSIS — F330 Major depressive disorder, recurrent, mild: Secondary | ICD-10-CM | POA: Diagnosis not present

## 2018-09-01 DIAGNOSIS — Z0001 Encounter for general adult medical examination with abnormal findings: Secondary | ICD-10-CM | POA: Diagnosis not present

## 2018-09-01 DIAGNOSIS — E7849 Other hyperlipidemia: Secondary | ICD-10-CM | POA: Diagnosis not present

## 2018-09-01 DIAGNOSIS — Z1389 Encounter for screening for other disorder: Secondary | ICD-10-CM | POA: Diagnosis not present

## 2018-09-22 ENCOUNTER — Encounter (HOSPITAL_COMMUNITY): Payer: Self-pay | Admitting: *Deleted

## 2018-09-22 ENCOUNTER — Emergency Department (HOSPITAL_COMMUNITY)
Admission: EM | Admit: 2018-09-22 | Discharge: 2018-09-22 | Disposition: A | Discharge status: Home / Self Care | Payer: PPO | Attending: Emergency Medicine | Admitting: Emergency Medicine

## 2018-09-22 ENCOUNTER — Emergency Department (HOSPITAL_COMMUNITY): Payer: PPO

## 2018-09-22 ENCOUNTER — Other Ambulatory Visit: Payer: Self-pay

## 2018-09-22 DIAGNOSIS — F17210 Nicotine dependence, cigarettes, uncomplicated: Secondary | ICD-10-CM | POA: Diagnosis not present

## 2018-09-22 DIAGNOSIS — Z9049 Acquired absence of other specified parts of digestive tract: Secondary | ICD-10-CM | POA: Diagnosis not present

## 2018-09-22 DIAGNOSIS — N390 Urinary tract infection, site not specified: Secondary | ICD-10-CM | POA: Insufficient documentation

## 2018-09-22 DIAGNOSIS — Z859 Personal history of malignant neoplasm, unspecified: Secondary | ICD-10-CM | POA: Insufficient documentation

## 2018-09-22 DIAGNOSIS — Z79899 Other long term (current) drug therapy: Secondary | ICD-10-CM | POA: Insufficient documentation

## 2018-09-22 DIAGNOSIS — I10 Essential (primary) hypertension: Secondary | ICD-10-CM | POA: Insufficient documentation

## 2018-09-22 DIAGNOSIS — R1031 Right lower quadrant pain: Secondary | ICD-10-CM | POA: Diagnosis present

## 2018-09-22 DIAGNOSIS — R0602 Shortness of breath: Secondary | ICD-10-CM | POA: Diagnosis not present

## 2018-09-22 LAB — URINALYSIS, ROUTINE W REFLEX MICROSCOPIC
Bilirubin Urine: NEGATIVE
Glucose, UA: NEGATIVE mg/dL
Ketones, ur: NEGATIVE mg/dL
Nitrite: POSITIVE — AB
Protein, ur: NEGATIVE mg/dL
Specific Gravity, Urine: 1.011 (ref 1.005–1.030)
WBC, UA: >50 WBC/hpf — ABNORMAL HIGH (ref 0–5)
pH: 5 (ref 5.0–8.0)

## 2018-09-22 LAB — COMPREHENSIVE METABOLIC PANEL
ALT: 33 U/L (ref 0–44)
AST: 37 U/L (ref 15–41)
Albumin: 4 g/dL (ref 3.5–5.0)
Alkaline Phosphatase: 50 U/L (ref 38–126)
Anion gap: 6 (ref 5–15)
BUN: 17 mg/dL (ref 8–23)
CO2: 29 mmol/L (ref 22–32)
Calcium: 9.3 mg/dL (ref 8.9–10.3)
Chloride: 104 mmol/L (ref 98–111)
Creatinine, Ser: 0.75 mg/dL (ref 0.44–1.00)
GFR calc Af Amer: >60 mL/min (ref >60)
GFR calc non Af Amer: >60 mL/min (ref >60)
Glucose, Bld: 106 mg/dL — ABNORMAL HIGH (ref 70–99)
Potassium: 4.7 mmol/L (ref 3.5–5.1)
Sodium: 139 mmol/L (ref 135–145)
Total Bilirubin: 1.2 mg/dL (ref 0.3–1.2)
Total Protein: 6.9 g/dL (ref 6.5–8.1)

## 2018-09-22 LAB — CBC WITH DIFFERENTIAL/PLATELET
Abs Immature Granulocytes: 0.03 10*3/uL (ref 0.00–0.07)
Basophils Absolute: 0.1 10*3/uL (ref 0.0–0.1)
Basophils Relative: 1 %
Eosinophils Absolute: 0.2 10*3/uL (ref 0.0–0.5)
Eosinophils Relative: 2 %
HCT: 40.8 % (ref 36.0–46.0)
Hemoglobin: 13.1 g/dL (ref 12.0–15.0)
Immature Granulocytes: 0 %
Lymphocytes Relative: 35 %
Lymphs Abs: 2.3 10*3/uL (ref 0.7–4.0)
MCH: 30.5 pg (ref 26.0–34.0)
MCHC: 32.1 g/dL (ref 30.0–36.0)
MCV: 95.1 fL (ref 80.0–100.0)
Monocytes Absolute: 0.7 10*3/uL (ref 0.1–1.0)
Monocytes Relative: 10 %
Neutro Abs: 3.5 10*3/uL (ref 1.7–7.7)
Neutrophils Relative %: 52 %
Platelets: 179 10*3/uL (ref 150–400)
RBC: 4.29 MIL/uL (ref 3.87–5.11)
RDW: 13.2 % (ref 11.5–15.5)
WBC: 6.8 10*3/uL (ref 4.0–10.5)
nRBC: 0 % (ref 0.0–0.2)

## 2018-09-22 LAB — TROPONIN I (HIGH SENSITIVITY)
Troponin I (High Sensitivity): 2 ng/L (ref <18)
Troponin I (High Sensitivity): 3 ng/L (ref <18)

## 2018-09-22 MED ORDER — CEPHALEXIN 500 MG PO CAPS: 500.0000 mg | ORAL_CAPSULE | Freq: Four times a day (QID) | ORAL | 0 refills | Status: AC

## 2018-09-22 NOTE — ED Provider Notes (Signed)
[facility] EMERGENCY DEPARTMENT Provider Note   CSN: [id number] Arrival date & time: 09/22/18  1517     History   Chief Complaint Chief Complaint  Patient presents with  . Leg Swelling    HPI Linda Williamson is a 84 y.o. female.     Patient presents with multiple complaints she reports she has had some pain in her right lower abdomen as well as some swelling in her bilateral lower legs left greater than right patient states that she has noticed little red and brown spots on her lower legs.  Patient states she has been feeling dizzy for the past 2 days.  Patient reports she has pain in her right shoulder she has a history of a rotator cuff tear states that she receives injections in her shoulder she has not had the opportunity to get an injection recently because she is providing full care for her husband.  The history is provided by the patient. No language interpreter was used.    Past Medical History:  Diagnosis Date  . Bilateral lower extremity edema   . Bilateral lower extremity edema 04/2013  . Cancer ([place])   . Cataract   . Hypertension   . Thyroid disease     Patient Active Problem List   Diagnosis Date Noted  . Essential hypertension 04/25/2013  . Bilateral lower extremity edema 04/25/2013  . Weakness of right arm 01/05/2012  . Numbness and tingling in right hand 01/05/2012  . OTHER DYSPHAGIA 12/09/2009  . GASTROESOPHAGEAL REFLUX DISEASE, HX OF 12/05/2009  . SPONDYLOSIS 10/17/2008  . [place] DISEASE, LUMBAR SPINE 10/17/2008  . BURSITIS, HIP 10/17/2008    Past Surgical History:  Procedure Laterality Date  . ABDOMINAL HYSTERECTOMY    . APPENDECTOMY    . CATARACT EXTRACTION    . CHOLECYSTECTOMY    . THROAT SURGERY       OB History   No obstetric history on file.      Home Medications    Prior to Admission medications   Medication Sig Start Date End Date Taking? Authorizing Provider  ALPRAZolam Duanne Moron) 0.5 MG tablet Take 0.5 mg by mouth  at bedtime.   Yes [provider]  amLODipine (NORVASC) 5 MG tablet  05/04/16  Yes [provider]  esomeprazole (NEXIUM) 40 MG capsule Take by mouth.   Yes [provider]  levothyroxine (SYNTHROID, LEVOTHROID) 50 MCG tablet Take 50 mcg by mouth daily.   Yes [provider]  naproxen sodium (ALEVE) 220 MG tablet Take 220 mg by mouth 2 (two) times daily as needed (pain).   Yes [provider]  cephALEXin (KEFLEX) 500 MG capsule Take 1 capsule (500 mg total) by mouth 4 (four) times daily for 7 days. 09/22/18 09/29/18  Fransico Meadow, PA-C    Family History Family History  Problem Relation Age of Onset  . Heart failure Mother   . Cancer Father        Bladder  . COPD Brother   . Heart disease Sister   . COPD Brother     Social History Social History   Tobacco Use  . Smoking status: Current Every Day Smoker    Packs/day: 0.50    Types: Cigarettes  . Smokeless tobacco: Never Used  Substance Use Topics  . Alcohol use: Yes    Comment: glass of wine occasionally   . Drug use: No     Allergies   Penicillins   Review of Systems Review of Systems  Respiratory: Positive for cough and shortness of breath.   Cardiovascular: Negative for chest pain.  Musculoskeletal: Positive for arthralgias.  Skin: Positive for color change.  All other systems reviewed and are negative.    Physical Exam Updated Vital Signs BP 123/69 (BP Location: Right Arm)   Pulse 85   Temp 98.1 F (36.7 C) (Oral)   Resp 16   Ht 5\' 9"  (1.753 m)   Wt 79.4 kg   SpO2 94%   BMI 25.84 kg/m   Physical Exam Vitals signs and nursing note reviewed.  Constitutional:      Appearance: She is well-developed.  HENT:     Head: Normocephalic.     Mouth/Throat:     Mouth: Mucous membranes are moist.  Eyes:     Pupils: Pupils are equal, round, and reactive to light.  Neck:     Musculoskeletal: Normal range of motion.  Cardiovascular:     Rate and Rhythm: Normal  rate.  Pulmonary:     Effort: Pulmonary effort is normal.  Abdominal:     General: There is no distension.  Musculoskeletal: Normal range of motion.        General: No swelling.     Comments: Patient has brown spots on bilateral lower legs consistent with sun damage.  She has multiple small varicosities.  Patient does not have any edema in her lower extremities she has a negative Homans sign Right shoulder patient has good range of motion she has discomfort with range of motion neurovascular neurosensory are intact  Skin:    General: Skin is warm.  Neurological:     General: No focal deficit present.     Mental Status: She is alert and oriented to person, place, and time.  Psychiatric:        Mood and Affect: Mood normal.      ED Treatments / Results  Labs (all labs ordered are listed, but only abnormal results are displayed) Labs Reviewed  COMPREHENSIVE METABOLIC PANEL - Abnormal; Notable for the following components:      Result Value   Glucose, Bld 106 (*)    All other components within normal limits  URINALYSIS, ROUTINE W REFLEX MICROSCOPIC - Abnormal; Notable for the following components:   APPearance HAZY (*)    Hgb urine dipstick SMALL (*)    Nitrite POSITIVE (*)    Leukocytes,Ua LARGE (*)    WBC, UA >50 (*)    Bacteria, UA MANY (*)    All other components within normal limits  CBC WITH DIFFERENTIAL/PLATELET  TROPONIN I (HIGH SENSITIVITY)  TROPONIN I (HIGH SENSITIVITY)    EKG EKG Interpretation  Date/Time:  Friday September 22 2018 17:17:47 EDT Ventricular Rate:  76 PR Interval:    QRS Duration: 100 QT Interval:  407 QTC Calculation: 458 R Axis:   86 Text Interpretation:  Sinus rhythm Borderline right axis deviation Nonspecific T abnrm, anterolateral leads Baseline wander When compared with ECG of 04/25/2013, 09/27/2001 No significant change was found Confirmed by Francine Graven [phone]) on 09/22/2018 5:20:46 PM   Radiology Dg Chest Port 1 View  Result Date:  09/22/2018 CLINICAL DATA:  Shortness of breath, leg swelling and pain since yesterday. EXAM: PORTABLE CHEST 1 VIEW COMPARISON:  Radiograph 05/05/2011 FINDINGS: Hyperaeration of the lungs with apical lucency compatible with CHF. Calcific density projecting in the right lung base most likely reflects costal cartilage mineralization. No consolidative process. No edema. No pneumothorax or visible effusion. Cardiomediastinal contours are stable. Aorta is calcified and tortuous. No  acute osseous or soft tissue abnormality. Multilevel degenerative changes are present in the imaged portions of the spine. IMPRESSION: No acute cardiopulmonary abnormality. Features of COPD/emphysema Electronically Signed   By: Lovena Le M.D.   On: 09/22/2018 17:04    Procedures Procedures (including critical care time)  Medications Ordered in ED Medications - No data to display   Initial Impression / Assessment and Plan / ED Course  I have reviewed the triage vital signs and the nursing notes.  Pertinent labs & imaging results that were available during my care of the patient were reviewed by me and considered in my medical decision making (see chart for details).        MDM patient advised to follow-up with her primary care doctor regarding her chronic shoulder pain.  patient's urinalysis returned and shows many bacteria and greater than 50 white blood cells.  Prescribed Keflex 500 mg patient is advised she needs to see her primary care physician for recheck in 1 week and repeat UA I will also obtain a culture of her urine.  Patient's EKG is unchanged her troponin is negative patient's chemistries specifically renal function are normal.  Patient's chest x-ray shows some COPD.  Discussed patient patient's smoking.  Patient plans to continue smoking as she is 84 years old and does not want to stop. I spoke with patient's daughter daughter has some concerns the patient could have a blood clot in her leg.  I think this is  unlikely as patient has a negative Homans  and no current leg swelling.   Final Clinical Impressions(s) / ED Diagnoses   Final diagnoses:  Urinary tract infection without hematuria, site unspecified    ED Discharge Orders         Ordered    cephALEXin (KEFLEX) 500 MG capsule  4 times daily     09/22/18 1859           Sidney Ace 09/22/18 [place], [place], DO 09/24/18 1524

## 2018-09-22 NOTE — ED Notes (Signed)
Radiology at bedside

## 2018-09-22 NOTE — ED Triage Notes (Signed)
Patient brought in by POV for bilateral leg swelling and pain since yesterday.  Patient also complains of lower right abdominal pain and intermittent dizziness for 2 days.

## 2018-09-22 NOTE — Discharge Instructions (Signed)
Return if any problems.

## 2018-09-23 NOTE — ED Notes (Signed)
Informed Dr Sabra Heck that pt is allergic to Keflex, new order given Bactrim DS one tablet po BID for 7 days.  New orders given to pharmacist Erlene Quan at [facility].

## 2018-09-25 LAB — URINE CULTURE: Culture: >=100000 — AB

## 2018-09-26 ENCOUNTER — Telehealth: Payer: Self-pay | Admitting: Emergency Medicine

## 2018-09-26 NOTE — Telephone Encounter (Signed)
Post ED Visit - Positive Culture Follow-up  Culture report reviewed by antimicrobial stewardship pharmacist: [place] Team []  Elenor Quinones, Pharm.D. []  Heide Guile, Pharm.D., BCPS AQ-ID []  Parks Neptune, Pharm.D., BCPS []  Alycia Rossetti, Pharm.D., BCPS []  [place], Pharm.D., BCPS, AAHIVP []  Legrand Como, Pharm.D., BCPS, AAHIVP []  Salome Arnt, PharmD, BCPS []  Johnnette Gourd, PharmD, BCPS [x]  Hughes Better, PharmD, BCPS []  Leeroy Cha, PharmD []  Laqueta Linden, PharmD, BCPS []  Albertina Parr, PharmD  [place] Team []  Leodis Sias, PharmD []  Lindell Spar, PharmD []  Royetta Asal, PharmD []  Graylin Shiver, Rph []  Rema Fendt) Glennon Mac, PharmD []  Arlyn Dunning, PharmD []  Netta Cedars, PharmD []  Dia Sitter, PharmD []  Leone Haven, PharmD []  Gretta Arab, PharmD []  Theodis Shove, PharmD []  Peggyann Juba, PharmD []  Reuel Boom, PharmD   Positive urine culture Treated with cephalexin, organism sensitive to the same and no further patient follow-up is required at this time.  Hazle Nordmann 09/26/2018, 10:26 AM

## 2018-09-29 DIAGNOSIS — N390 Urinary tract infection, site not specified: Secondary | ICD-10-CM | POA: Diagnosis not present

## 2018-09-29 DIAGNOSIS — E663 Overweight: Secondary | ICD-10-CM | POA: Diagnosis not present

## 2018-09-29 DIAGNOSIS — Z1389 Encounter for screening for other disorder: Secondary | ICD-10-CM | POA: Diagnosis not present

## 2018-09-29 DIAGNOSIS — Z6825 Body mass index (BMI) 25.0-25.9, adult: Secondary | ICD-10-CM | POA: Diagnosis not present

## 2018-11-09 DIAGNOSIS — E7849 Other hyperlipidemia: Secondary | ICD-10-CM | POA: Diagnosis not present

## 2018-11-09 DIAGNOSIS — I10 Essential (primary) hypertension: Secondary | ICD-10-CM | POA: Diagnosis not present

## 2018-11-09 DIAGNOSIS — F419 Anxiety disorder, unspecified: Secondary | ICD-10-CM | POA: Diagnosis not present

## 2018-11-09 DIAGNOSIS — E063 Autoimmune thyroiditis: Secondary | ICD-10-CM | POA: Diagnosis not present

## 2018-11-09 DIAGNOSIS — Z6825 Body mass index (BMI) 25.0-25.9, adult: Secondary | ICD-10-CM | POA: Diagnosis not present

## 2018-11-14 DIAGNOSIS — M75121 Complete rotator cuff tear or rupture of right shoulder, not specified as traumatic: Secondary | ICD-10-CM | POA: Diagnosis not present

## 2018-12-08 DIAGNOSIS — Z23 Encounter for immunization: Secondary | ICD-10-CM | POA: Diagnosis not present

## 2018-12-14 ENCOUNTER — Ambulatory Visit (INDEPENDENT_AMBULATORY_CARE_PROVIDER_SITE_OTHER): Payer: PPO | Admitting: Adult Health

## 2018-12-14 ENCOUNTER — Encounter: Payer: Self-pay | Admitting: Adult Health

## 2018-12-14 ENCOUNTER — Other Ambulatory Visit: Payer: Self-pay

## 2018-12-14 VITALS — BP 132/70 | HR 81 | Ht 69.0 in | Wt 171.4 lb

## 2018-12-14 DIAGNOSIS — R319 Hematuria, unspecified: Secondary | ICD-10-CM

## 2018-12-14 DIAGNOSIS — R300 Dysuria: Secondary | ICD-10-CM | POA: Insufficient documentation

## 2018-12-14 DIAGNOSIS — R350 Frequency of micturition: Secondary | ICD-10-CM

## 2018-12-14 LAB — POCT URINALYSIS DIPSTICK OB
Glucose, UA: NEGATIVE
Ketones, UA: NEGATIVE
Nitrite, UA: NEGATIVE
POC,PROTEIN,UA: NEGATIVE

## 2018-12-14 MED ORDER — SULFAMETHOXAZOLE-TRIMETHOPRIM 800-160 MG PO TABS: 1.0000 | ORAL_TABLET | Freq: Two times a day (BID) | ORAL | 0 refills | Status: DC

## 2018-12-14 NOTE — Progress Notes (Signed)
  Subjective:     Patient ID: Linda Williamson, female   DOB: 04-03-1934, 84 y.o.   MRN: [id number]  HPI Linda Williamson is a 84 year old white female, married, sp hysterectomy in complaining of urinary frequency and burning. PCP is Delman Cheadle PA  Review of Systems +urinary frequency +dysuria  +low back pain Stitch in vagina is sticking her from mesh  Reviewed past medical,surgical, social and family history. Reviewed medications and allergies.      Objective:   Physical Exam BP 132/70 (BP Location: Left Arm, Patient Position: Sitting, Cuff Size: Normal)   Pulse 81   Ht 5\' 9"  (1.753 m)   Wt 171 lb 6.4 oz (77.7 kg)   BMI 25.31 kg/m   Urine dipstick moderate leuks and blood, looks clear, not enough to culture Skin warm and dry.Pelvic: external genitalia is normal in appearance no lesions, vagina: atrophic, can see mesh and feel stitch, Dr Rip Harbour in to clip stitch and applied silver nitrate.,urethra has no lesions or masses noted, cervix and uterus are absent, adnexa: no masses or tenderness noted. Bladder is non tender and no masses felt. Fall risk is low PHQ 2 score is 0. Examination chaperoned by [facility] LPN.    Assessment:     1. Hematuria, unspecified type  2. Urinary frequency  3. Dysuria     Plan:    -push water -will rx septra ds Meds ordered this encounter  Medications  . sulfamethoxazole-trimethoprim (BACTRIM DS) 800-160 MG tablet    Sig: Take 1 tablet by mouth 2 (two) times daily. Take 1 bid    Dispense:  14 tablet    Refill:  0    Order Specific Question:   Supervising Provider    Answer:   Tania Ade H [[id number]]  Follow up prn

## 2019-02-06 ENCOUNTER — Other Ambulatory Visit: Payer: Self-pay

## 2019-02-06 DIAGNOSIS — U071 COVID-19: Secondary | ICD-10-CM | POA: Diagnosis not present

## 2019-02-06 DIAGNOSIS — Z20822 Contact with and (suspected) exposure to covid-19: Secondary | ICD-10-CM

## 2019-02-07 ENCOUNTER — Inpatient Hospital Stay (HOSPITAL_COMMUNITY)
Admission: EM | Admit: 2019-02-07 | Discharge: 2019-02-09 | DRG: 190 | Disposition: A | Discharge status: Home / Self Care | Payer: PPO | Attending: Internal Medicine | Admitting: Internal Medicine

## 2019-02-07 ENCOUNTER — Emergency Department (HOSPITAL_COMMUNITY): Payer: PPO

## 2019-02-07 ENCOUNTER — Inpatient Hospital Stay (HOSPITAL_COMMUNITY): Payer: PPO

## 2019-02-07 ENCOUNTER — Encounter (HOSPITAL_COMMUNITY): Payer: Self-pay | Admitting: Emergency Medicine

## 2019-02-07 ENCOUNTER — Other Ambulatory Visit: Payer: Self-pay

## 2019-02-07 DIAGNOSIS — Z9049 Acquired absence of other specified parts of digestive tract: Secondary | ICD-10-CM

## 2019-02-07 DIAGNOSIS — J440 Chronic obstructive pulmonary disease with acute lower respiratory infection: Secondary | ICD-10-CM | POA: Diagnosis present

## 2019-02-07 DIAGNOSIS — K219 Gastro-esophageal reflux disease without esophagitis: Secondary | ICD-10-CM | POA: Diagnosis not present

## 2019-02-07 DIAGNOSIS — F419 Anxiety disorder, unspecified: Secondary | ICD-10-CM | POA: Diagnosis present

## 2019-02-07 DIAGNOSIS — J441 Chronic obstructive pulmonary disease with (acute) exacerbation: Principal | ICD-10-CM | POA: Diagnosis present

## 2019-02-07 DIAGNOSIS — J189 Pneumonia, unspecified organism: Secondary | ICD-10-CM | POA: Diagnosis not present

## 2019-02-07 DIAGNOSIS — Z825 Family history of asthma and other chronic lower respiratory diseases: Secondary | ICD-10-CM

## 2019-02-07 DIAGNOSIS — R0602 Shortness of breath: Secondary | ICD-10-CM | POA: Diagnosis not present

## 2019-02-07 DIAGNOSIS — Z85819 Personal history of malignant neoplasm of unspecified site of lip, oral cavity, and pharynx: Secondary | ICD-10-CM

## 2019-02-07 DIAGNOSIS — Z79890 Hormone replacement therapy (postmenopausal): Secondary | ICD-10-CM | POA: Diagnosis not present

## 2019-02-07 DIAGNOSIS — R05 Cough: Secondary | ICD-10-CM | POA: Diagnosis not present

## 2019-02-07 DIAGNOSIS — R0603 Acute respiratory distress: Secondary | ICD-10-CM

## 2019-02-07 DIAGNOSIS — Z79899 Other long term (current) drug therapy: Secondary | ICD-10-CM

## 2019-02-07 DIAGNOSIS — Z881 Allergy status to other antibiotic agents status: Secondary | ICD-10-CM | POA: Diagnosis not present

## 2019-02-07 DIAGNOSIS — Z8249 Family history of ischemic heart disease and other diseases of the circulatory system: Secondary | ICD-10-CM

## 2019-02-07 DIAGNOSIS — Z716 Tobacco abuse counseling: Secondary | ICD-10-CM | POA: Diagnosis not present

## 2019-02-07 DIAGNOSIS — J9601 Acute respiratory failure with hypoxia: Secondary | ICD-10-CM | POA: Diagnosis not present

## 2019-02-07 DIAGNOSIS — I10 Essential (primary) hypertension: Secondary | ICD-10-CM | POA: Diagnosis not present

## 2019-02-07 DIAGNOSIS — F17210 Nicotine dependence, cigarettes, uncomplicated: Secondary | ICD-10-CM | POA: Diagnosis present

## 2019-02-07 DIAGNOSIS — E039 Hypothyroidism, unspecified: Secondary | ICD-10-CM | POA: Diagnosis present

## 2019-02-07 DIAGNOSIS — Z90710 Acquired absence of both cervix and uterus: Secondary | ICD-10-CM

## 2019-02-07 DIAGNOSIS — Z7289 Other problems related to lifestyle: Secondary | ICD-10-CM | POA: Diagnosis not present

## 2019-02-07 DIAGNOSIS — Z20828 Contact with and (suspected) exposure to other viral communicable diseases: Secondary | ICD-10-CM | POA: Diagnosis not present

## 2019-02-07 DIAGNOSIS — Z880 Allergy status to penicillin: Secondary | ICD-10-CM | POA: Diagnosis not present

## 2019-02-07 DIAGNOSIS — R000 Tachycardia, unspecified: Secondary | ICD-10-CM | POA: Diagnosis not present

## 2019-02-07 LAB — COMPREHENSIVE METABOLIC PANEL
ALT: 40 U/L (ref 0–44)
AST: 50 U/L — ABNORMAL HIGH (ref 15–41)
Albumin: 2.9 g/dL — ABNORMAL LOW (ref 3.5–5.0)
Alkaline Phosphatase: 62 U/L (ref 38–126)
Anion gap: 10 (ref 5–15)
BUN: 11 mg/dL (ref 8–23)
CO2: 27 mmol/L (ref 22–32)
Calcium: 8.6 mg/dL — ABNORMAL LOW (ref 8.9–10.3)
Chloride: 100 mmol/L (ref 98–111)
Creatinine, Ser: 0.59 mg/dL (ref 0.44–1.00)
GFR calc Af Amer: >60 mL/min (ref >60)
GFR calc non Af Amer: >60 mL/min (ref >60)
Glucose, Bld: 115 mg/dL — ABNORMAL HIGH (ref 70–99)
Potassium: 3.5 mmol/L (ref 3.5–5.1)
Sodium: 137 mmol/L (ref 135–145)
Total Bilirubin: 1.3 mg/dL — ABNORMAL HIGH (ref 0.3–1.2)
Total Protein: 6.3 g/dL — ABNORMAL LOW (ref 6.5–8.1)

## 2019-02-07 LAB — CBC WITH DIFFERENTIAL/PLATELET
Abs Immature Granulocytes: 0.07 10*3/uL (ref 0.00–0.07)
Basophils Absolute: 0.1 10*3/uL (ref 0.0–0.1)
Basophils Relative: 1 %
Eosinophils Absolute: 0.1 10*3/uL (ref 0.0–0.5)
Eosinophils Relative: 1 %
HCT: 39.4 % (ref 36.0–46.0)
Hemoglobin: 12.6 g/dL (ref 12.0–15.0)
Immature Granulocytes: 1 %
Lymphocytes Relative: 15 %
Lymphs Abs: 1.7 10*3/uL (ref 0.7–4.0)
MCH: 31 pg (ref 26.0–34.0)
MCHC: 32 g/dL (ref 30.0–36.0)
MCV: 96.8 fL (ref 80.0–100.0)
Monocytes Absolute: 1.8 10*3/uL — ABNORMAL HIGH (ref 0.1–1.0)
Monocytes Relative: 16 %
Neutro Abs: 7.8 10*3/uL — ABNORMAL HIGH (ref 1.7–7.7)
Neutrophils Relative %: 66 %
Platelets: 212 10*3/uL (ref 150–400)
RBC: 4.07 MIL/uL (ref 3.87–5.11)
RDW: 13.2 % (ref 11.5–15.5)
WBC: 11.6 10*3/uL — ABNORMAL HIGH (ref 4.0–10.5)
nRBC: 0 % (ref 0.0–0.2)

## 2019-02-07 LAB — POC SARS CORONAVIRUS 2 AG -  ED: SARS Coronavirus 2 Ag: NEGATIVE

## 2019-02-07 LAB — APTT: aPTT: 33 seconds (ref 24–36)

## 2019-02-07 LAB — LACTIC ACID, PLASMA
Lactic Acid, Venous: 1.2 mmol/L (ref 0.5–1.9)
Lactic Acid, Venous: 1.3 mmol/L (ref 0.5–1.9)

## 2019-02-07 LAB — TSH: TSH: 1.909 u[IU]/mL (ref 0.350–4.500)

## 2019-02-07 LAB — SARS CORONAVIRUS 2 BY RT PCR (HOSPITAL ORDER, PERFORMED IN ~~LOC~~ HOSPITAL LAB): SARS Coronavirus 2: NEGATIVE

## 2019-02-07 LAB — PROCALCITONIN: Procalcitonin: 0.1 ng/mL

## 2019-02-07 LAB — LACTATE DEHYDROGENASE: LDH: 124 U/L (ref 98–192)

## 2019-02-07 LAB — FERRITIN: Ferritin: 114 ng/mL (ref 11–307)

## 2019-02-07 LAB — PROTIME-INR
INR: 1.1 (ref 0.8–1.2)
Prothrombin Time: 14.1 seconds (ref 11.4–15.2)

## 2019-02-07 LAB — BRAIN NATRIURETIC PEPTIDE: B Natriuretic Peptide: 82 pg/mL (ref 0.0–100.0)

## 2019-02-07 LAB — TROPONIN I (HIGH SENSITIVITY): Troponin I (High Sensitivity): 23 ng/L — ABNORMAL HIGH (ref <18)

## 2019-02-07 LAB — FIBRINOGEN: Fibrinogen: >800 mg/dL — ABNORMAL HIGH (ref 210–475)

## 2019-02-07 LAB — TRIGLYCERIDES: Triglycerides: 63 mg/dL (ref <150)

## 2019-02-07 LAB — D-DIMER, QUANTITATIVE: D-Dimer, Quant: 2.73 ug/mL-FEU — ABNORMAL HIGH (ref 0.00–0.50)

## 2019-02-07 LAB — C-REACTIVE PROTEIN: CRP: 30.3 mg/dL — ABNORMAL HIGH (ref <1.0)

## 2019-02-07 MED ORDER — BUDESONIDE 0.25 MG/2ML IN SUSP
0.2500 mg | Freq: Two times a day (BID) | RESPIRATORY_TRACT | Status: DC
  Administered 2019-02-07 – 2019-02-09 (×4): 0.25 mg via RESPIRATORY_TRACT
  Filled 2019-02-07 (×4): qty 2

## 2019-02-07 MED ORDER — LEVOTHYROXINE SODIUM 50 MCG PO TABS
50.0000 ug | ORAL_TABLET | Freq: Every day | ORAL | Status: DC
  Administered 2019-02-08 – 2019-02-09 (×2): 50 ug via ORAL
  Filled 2019-02-07 (×2): qty 1

## 2019-02-07 MED ORDER — NICOTINE 7 MG/24HR TD PT24
7.0000 mg | MEDICATED_PATCH | Freq: Every day | TRANSDERMAL | Status: DC
  Administered 2019-02-07 – 2019-02-09 (×3): 7 mg via TRANSDERMAL
  Filled 2019-02-07 (×3): qty 1

## 2019-02-07 MED ORDER — AEROCHAMBER Z-STAT PLUS/MEDIUM MISC
1.0000 | Freq: Once | Status: AC
  Administered 2019-02-07: 1

## 2019-02-07 MED ORDER — NAPROXEN SODIUM 550 MG PO TABS: 225.0000 mg | ORAL_TABLET | Freq: Two times a day (BID) | ORAL | Status: DC | PRN

## 2019-02-07 MED ORDER — ACETAMINOPHEN 325 MG PO TABS: 650.0000 mg | ORAL_TABLET | Freq: Four times a day (QID) | ORAL | Status: DC | PRN

## 2019-02-07 MED ORDER — ENOXAPARIN SODIUM 40 MG/0.4ML ~~LOC~~ SOLN
40.0000 mg | SUBCUTANEOUS | Status: DC
  Administered 2019-02-07 – 2019-02-09 (×3): 40 mg via SUBCUTANEOUS
  Filled 2019-02-07 (×3): qty 0.4

## 2019-02-07 MED ORDER — IOHEXOL 350 MG/ML SOLN
100.0000 mL | Freq: Once | INTRAVENOUS | Status: AC | PRN
  Administered 2019-02-07: 100 mL via INTRAVENOUS

## 2019-02-07 MED ORDER — ALPRAZOLAM 0.5 MG PO TABS
0.5000 mg | ORAL_TABLET | Freq: Every day | ORAL | Status: DC
  Administered 2019-02-07 – 2019-02-08 (×2): 0.5 mg via ORAL
  Filled 2019-02-07 (×2): qty 1

## 2019-02-07 MED ORDER — SODIUM CHLORIDE 0.9% FLUSH
3.0000 mL | Freq: Two times a day (BID) | INTRAVENOUS | Status: DC
  Administered 2019-02-07 – 2019-02-09 (×4): 3 mL via INTRAVENOUS

## 2019-02-07 MED ORDER — ALBUTEROL SULFATE HFA 108 (90 BASE) MCG/ACT IN AERS
2.0000 | INHALATION_SPRAY | RESPIRATORY_TRACT | Status: AC
  Administered 2019-02-07: 07:00:00 2 via RESPIRATORY_TRACT
  Filled 2019-02-07: qty 6.7

## 2019-02-07 MED ORDER — SODIUM CHLORIDE 0.9 % IV SOLN: 250.0000 mL | INTRAVENOUS | Status: DC | PRN

## 2019-02-07 MED ORDER — LEVOFLOXACIN IN D5W 750 MG/150ML IV SOLN
750.0000 mg | INTRAVENOUS | Status: DC
  Administered 2019-02-08 – 2019-02-09 (×2): 750 mg via INTRAVENOUS
  Filled 2019-02-07 (×2): qty 150

## 2019-02-07 MED ORDER — ONDANSETRON HCL 4 MG/2ML IJ SOLN: 4.0000 mg | Freq: Four times a day (QID) | INTRAMUSCULAR | Status: DC | PRN

## 2019-02-07 MED ORDER — AMLODIPINE BESYLATE 5 MG PO TABS
5.0000 mg | ORAL_TABLET | Freq: Every day | ORAL | Status: DC
  Administered 2019-02-07 – 2019-02-09 (×3): 5 mg via ORAL
  Filled 2019-02-07 (×3): qty 1

## 2019-02-07 MED ORDER — SODIUM CHLORIDE 0.9% FLUSH: 3.0000 mL | INTRAVENOUS | Status: DC | PRN

## 2019-02-07 MED ORDER — ONDANSETRON HCL 4 MG PO TABS: 4.0000 mg | ORAL_TABLET | Freq: Four times a day (QID) | ORAL | Status: DC | PRN

## 2019-02-07 MED ORDER — PANTOPRAZOLE SODIUM 40 MG PO TBEC
40.0000 mg | DELAYED_RELEASE_TABLET | Freq: Every day | ORAL | Status: DC
  Administered 2019-02-07 – 2019-02-09 (×3): 40 mg via ORAL
  Filled 2019-02-07 (×3): qty 1

## 2019-02-07 MED ORDER — GUAIFENESIN-DM 100-10 MG/5ML PO SYRP
5.0000 mL | ORAL_SOLUTION | ORAL | Status: DC | PRN
  Administered 2019-02-08: 5 mL via ORAL
  Filled 2019-02-07: qty 5

## 2019-02-07 MED ORDER — LEVALBUTEROL HCL 0.63 MG/3ML IN NEBU
0.6300 mg | INHALATION_SOLUTION | Freq: Four times a day (QID) | RESPIRATORY_TRACT | Status: DC
  Administered 2019-02-07 – 2019-02-09 (×6): 0.63 mg via RESPIRATORY_TRACT
  Filled 2019-02-07 (×8): qty 3

## 2019-02-07 MED ORDER — LEVOFLOXACIN IN D5W 750 MG/150ML IV SOLN
750.0000 mg | Freq: Once | INTRAVENOUS | Status: AC
  Administered 2019-02-07: 750 mg via INTRAVENOUS
  Filled 2019-02-07: qty 150

## 2019-02-07 MED ORDER — METHYLPREDNISOLONE SODIUM SUCC 125 MG IJ SOLR
125.0000 mg | Freq: Once | INTRAMUSCULAR | Status: AC
  Administered 2019-02-07: 125 mg via INTRAVENOUS
  Filled 2019-02-07: qty 2

## 2019-02-07 MED ORDER — ACETAMINOPHEN 650 MG RE SUPP: 650.0000 mg | Freq: Four times a day (QID) | RECTAL | Status: DC | PRN

## 2019-02-07 MED ORDER — METHYLPREDNISOLONE SODIUM SUCC 40 MG IJ SOLR
40.0000 mg | Freq: Two times a day (BID) | INTRAMUSCULAR | Status: DC
  Administered 2019-02-07 – 2019-02-09 (×5): 40 mg via INTRAVENOUS
  Filled 2019-02-07 (×5): qty 1

## 2019-02-07 NOTE — H&P (Signed)
History and Physical    Linda Williamson [id number] DOB: Jul 18, 1934 DOA: 02/07/2019  PCP: Jake Samples, PA-C   Patient coming from: Home  Chief Complaint: Shortness of breath  HPI: Linda Williamson is a 84 y.o. female with medical history significant for prior throat cancer, active tobacco abuse with likely COPD, hypertension, hypothyroidism, anxiety disorder, and GERD who presented to the emergency department with progressive shortness of breath as well as dry cough that began 1 week ago.  This was shortly after she buried her husband who died from complications of multiple types of cancer.  She states that she was outside in the cold and rain and figured that she may have developed a viral illness.  She denies any chest pain, fevers, chills, or swelling in her legs.  She also denies any abdominal pain, nausea, vomiting, urinary frequency, or urgency.  Both she and her sister were tested for Covid at a drive-through testing facility yesterday, but do not have results as of yet.  She currently describes some wheezing and chest tightness which have improved after treatment in the ED.   ED Course: Vital signs are stable aside from some sinus tachycardia.  She is noted to be afebrile.  Lactic acid is 1.2 and there is a mild leukocytosis of 11,600.  She is currently on 2 L nasal cannula and does not wear oxygen at home.  1 view chest x-ray with right lower lobe pneumonia noted and procalcitonin 0.10.  D-dimer is 2.73.  CT angiogram of the chest ordered with no findings of PE, but there is right-sided multifocal pneumonia noted.  She has been started on IV Levaquin.  COVID-19 testing has been negative.  She is in no respiratory distress.  Review of Systems: All others reviewed and are otherwise negative except as noted above.  Past Medical History:  Diagnosis Date   Bilateral lower extremity edema    Bilateral lower extremity edema 04/2013   Cancer ([place])    Cataract    Hypertension     Thyroid disease     Past Surgical History:  Procedure Laterality Date   ABDOMINAL HYSTERECTOMY     APPENDECTOMY     CATARACT EXTRACTION     CHOLECYSTECTOMY     THROAT SURGERY       reports that she has been smoking cigarettes. She has been smoking about 0.50 packs per day. She has never used smokeless tobacco. She reports current alcohol use. She reports that she does not use drugs.  Allergies  Allergen Reactions   Keflex [Cephalexin]    Penicillins Rash    Family History  Problem Relation Age of Onset   Heart failure Mother    Cancer Father        Bladder   COPD Brother    Heart disease Sister    COPD Brother     Prior to Admission medications   Medication Sig Start Date End Date Taking? Authorizing Provider  ALPRAZolam Duanne Moron) 0.5 MG tablet Take 0.5 mg by mouth at bedtime.    [provider]  amLODipine (NORVASC) 5 MG tablet  05/04/16   [provider]  esomeprazole (NEXIUM) 40 MG capsule Take by mouth.    [provider]  levothyroxine (SYNTHROID, LEVOTHROID) 50 MCG tablet Take 50 mcg by mouth daily.    [provider]  naproxen sodium (ALEVE) 220 MG tablet Take 220 mg by mouth 2 (two) times daily as needed (pain).    [provider]  sulfamethoxazole-trimethoprim (BACTRIM  DS) 800-160 MG tablet Take 1 tablet by mouth 2 (two) times daily. Take 1 bid 12/14/18   Estill Dooms, NP    Physical Exam: Vitals:   02/07/19 0734 02/07/19 0745 02/07/19 0800 02/07/19 0957  BP: (!) 109/50  (!) 116/45 (!) 148/65  Pulse: 98 98 100 (!) 104  Resp: 20 (!) 29 (!) 22   Temp: 98.3 F (36.8 C)   98.2 F (36.8 C)  TempSrc: Oral   Oral  SpO2: 95% 95% 98% 91%  Weight:      Height:        Constitutional: NAD, calm, comfortable Vitals:   02/07/19 0734 02/07/19 0745 02/07/19 0800 02/07/19 0957  BP: (!) 109/50  (!) 116/45 (!) 148/65  Pulse: 98 98 100 (!) 104  Resp: 20 (!) 29 (!) 22   Temp: 98.3 F (36.8 C)   98.2 F (36.8  C)  TempSrc: Oral   Oral  SpO2: 95% 95% 98% 91%  Weight:      Height:       Eyes: lids and conjunctivae normal ENMT: Mucous membranes are moist.  Neck: normal, supple Respiratory: clear to auscultation bilaterally. Normal respiratory effort. No accessory muscle use.  Currently 2 L nasal cannula.  Cardiovascular: Regular rate and rhythm, no murmurs. No extremity edema. Abdomen: no tenderness, no distention. Bowel sounds positive.  Musculoskeletal:  No joint deformity upper and lower extremities.   Skin: no rashes, lesions, ulcers.  Psychiatric: Normal judgment and insight. Alert and oriented x 3. Normal mood.   Labs on Admission: I have personally reviewed following labs and imaging studies  CBC: Recent Labs  Lab 02/07/19 0623  WBC 11.6*  NEUTROABS 7.8*  HGB 12.6  HCT 39.4  MCV 96.8  PLT 99991111   Basic Metabolic Panel: Recent Labs  Lab 02/07/19 0623  NA 137  K 3.5  CL 100  CO2 27  GLUCOSE 115*  BUN 11  CREATININE 0.59  CALCIUM 8.6*   GFR: Estimated Creatinine Clearance: 56.7 mL/min (by C-G formula based on SCr of 0.59 mg/dL). Liver Function Tests: Recent Labs  Lab 02/07/19 0623  AST 50*  ALT 40  ALKPHOS 62  BILITOT 1.3*  PROT 6.3*  ALBUMIN 2.9*   No results for input(s): LIPASE, AMYLASE in the last 168 hours. No results for input(s): AMMONIA in the last 168 hours. Coagulation Profile: Recent Labs  Lab 02/07/19 0623  INR 1.1   Cardiac Enzymes: No results for input(s): CKTOTAL, CKMB, CKMBINDEX, TROPONINI in the last 168 hours. BNP (last 3 results) No results for input(s): PROBNP in the last 8760 hours. HbA1C: No results for input(s): HGBA1C in the last 72 hours. CBG: No results for input(s): GLUCAP in the last 168 hours. Lipid Profile: Recent Labs    02/07/19 0623  TRIG 63   Thyroid Function Tests: No results for input(s): TSH, T4TOTAL, FREET4, T3FREE, THYROIDAB in the last 72 hours. Anemia Panel: Recent Labs    02/07/19 0623  FERRITIN 114    Urine analysis:    Component Value Date/Time   COLORURINE YELLOW 09/22/2018 1730   APPEARANCEUR HAZY (A) 09/22/2018 1730   LABSPEC 1.011 09/22/2018 1730   PHURINE 5.0 09/22/2018 1730   GLUCOSEU Negative 12/14/2018 1521   GLUCOSEU NEGATIVE 09/22/2018 1730   HGBUR SMALL (A) 09/22/2018 1730   BILIRUBINUR NEGATIVE 09/22/2018 1730   KETONESUR NEGATIVE 09/22/2018 1730   PROTEINUR NEGATIVE 09/22/2018 1730   NITRITE n 12/14/2018 1521   NITRITE POSITIVE (A) 09/22/2018 1730   LEUKOCYTESUR Moderate (  2+) (A) 12/14/2018 1521   LEUKOCYTESUR LARGE (A) 09/22/2018 1730    Radiological Exams on Admission: Ct Angio Chest Pe W Or Wo Contrast  Result Date: 02/07/2019 CLINICAL DATA:  Shortness of breath.  History of throat carcinoma EXAM: CT ANGIOGRAPHY CHEST WITH CONTRAST TECHNIQUE: Multidetector CT imaging of the chest was performed using the standard protocol during bolus administration of intravenous contrast. Multiplanar CT image reconstructions and MIPs were obtained to evaluate the vascular anatomy. CONTRAST:  19mL OMNIPAQUE IOHEXOL 350 MG/ML SOLN COMPARISON:  Chest radiograph February 07, 2019 FINDINGS: Cardiovascular: There is no demonstrable pulmonary embolus. There is no thoracic aortic aneurysm or dissection. Visualized great vessels show multiple foci calcification with moderately severe narrowing of the origin of the left subclavian artery. There is extensive aortic atherosclerosis. There are foci of coronary artery calcification. There is no pericardial effusion or pericardial thickening. Mediastinum/Nodes: Thyroid appears unremarkable. There are several subcentimeter mediastinal lymph nodes. There is a lymph node anterior to the carina measuring 1.5 x 1.5 cm. A lymph node anterior to the left lower trachea measures 1.0 x 1.0 cm. No esophageal lesions are appreciable. Lungs/Pleura: There is airspace consolidation in the the medial segment of the right middle lobe. There is also an area of  consolidation abutting the right hemidiaphragm in the anterior segment right lower lobe. There are ill-defined areas of patchy airspace opacity scattered throughout the lungs bilaterally, more severe on the right than on the left. There is a degree of underlying centrilobular emphysematous change. No pleural effusions are evident. There is scarring in the apices, asymmetric on the right compared to the left. Upper Abdomen: There is hepatic steatosis. Gallbladder is absent. There is extensive aortic atherosclerosis in the visualized upper abdominal aorta. Musculoskeletal: There are no blastic or lytic bone lesions. No chest wall lesions are evident. Review of the MIP images confirms the above findings. IMPRESSION: 1.  No demonstrable pulmonary embolus. 2. Extensive aortic atherosclerosis as well as foci of great vessel and coronary artery calcification. No thoracic aortic aneurysm or dissection. 3. Underlying centrilobular emphysematous change. Multifocal pneumonia with consolidation most notably in the medial segment right middle lobe and anterior right base regions. More patchy infiltrate elsewhere. Question underlying atypical organism pneumonia. Scattered areas of scarring as well. 4. Prominent pericarinal region lymph nodes which may be reactive due to the parenchymal lung changes but potentially could have neoplastic etiology given history of throat carcinoma. 5.  Hepatic steatosis. 6.  Gallbladder absent. Aortic Atherosclerosis (ICD10-I70.0) and Emphysema (ICD10-J43.9). Electronically Signed   By: Lowella Grip III M.D.   On: 02/07/2019 09:01   Dg Chest Port 1 View  Result Date: 02/07/2019 CLINICAL DATA:  Cough and shortness of breath. EXAM: PORTABLE CHEST 1 VIEW COMPARISON:  Two-view chest x-ray 09/22/2018 FINDINGS: Heart size is normal. Asymmetric right basilar airspace disease is present with some volume loss. Left lung is clear. Atherosclerotic changes are again noted at the aortic arch.  IMPRESSION: 1. Asymmetric right lower lobe airspace disease with volume loss is concerning for pneumonia. 2. Aortic atherosclerosis. 3. Chronic changes of COPD. Electronically Signed   By: San Morelle M.D.   On: 02/07/2019 06:49    EKG: Independently reviewed.  Sinus tachycardia 115 bpm  Assessment/Plan Active Problems:   COPD with acute exacerbation (HCC)    Acute hypoxemic respiratory failure secondary to COPD exacerbation -Plan to wean oxygen as tolerated and use incentive spirometry -Up in chair with ambulation -Pulmicort twice daily -DuoNebs every 6 hours -Solu-Medrol 40 mg IV twice  daily  Right-sided multifocal pneumonia -Antitussives -Monitor cultures -Check urine strep pneumonia and Legionella -Continue on IV Levaquin daily  History of tobacco abuse and prior throat cancer -Counseled on cessation  Hypertension -Continue home amlodipine; currently controlled  Hypothyroidism -Continue home medication  GERD -Continue PPI  Anxiety disorder -Continue Xanax   DVT prophylaxis: Lovenox Code Status: Full Family Communication: Sister bedside Disposition Plan: Treatment for COPD exacerbation as well as pneumonia Consults called: None Admission status: Inpatient, telemetry   Betsaida [place] Darleen Crocker DO [facility] Pager [phone]  If 7PM-7AM, please contact night-coverage [email or web address] Password [facility]  02/07/2019, 11:31 AM

## 2019-02-07 NOTE — ED Triage Notes (Addendum)
Pt C/O SOB that started on Monday 02/05/2019. Pt denies pain. Pt 77% on RA.

## 2019-02-07 NOTE — ED Provider Notes (Signed)
[facility] EMERGENCY DEPARTMENT Provider Note   CSN: [id number] Arrival date & time: 02/07/19  [id number]     History   Chief Complaint Chief Complaint  Patient presents with  . Shortness of Breath    HPI Linda Williamson is a 84 y.o. female.     HPI  This patient is an 84 year old female, she has a known history of throat cancer status post surgery years ago and in remission.  She denies any known history of COPD or emphysema but still smokes cigarettes.  She denies history of heart failure, heart disease, liver or kidney failure.  She reports that approximately 1 week ago she buried her husband who died from complications of multiple types of cancer and since that time has had progressive shortness of breath with a cough as has her daughter who is currently staying with her.  They were feeling so sick that yesterday they went to get tested in the drive-through testing line at the hospital, they do not have the results yet.  The patient denies any fevers or chills but has had a progressive shortness of breath with a cough which seems dry and not associated with chest pain or swelling of the legs.  She also denies any nausea vomiting or diarrhea or urinary symptoms.  She is unsure if she has been exposed to anybody with Covid.  Past Medical History:  Diagnosis Date  . Bilateral lower extremity edema   . Bilateral lower extremity edema 04/2013  . Cancer ([place])   . Cataract   . Hypertension   . Thyroid disease     Patient Active Problem List   Diagnosis Date Noted  . Dysuria 12/14/2018  . Urinary frequency 12/14/2018  . Hematuria 12/14/2018  . Essential hypertension 04/25/2013  . Bilateral lower extremity edema 04/25/2013  . Weakness of right arm 01/05/2012  . Numbness and tingling in right hand 01/05/2012  . OTHER DYSPHAGIA 12/09/2009  . GASTROESOPHAGEAL REFLUX DISEASE, HX OF 12/05/2009  . SPONDYLOSIS 10/17/2008  . 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 BURSITIS,  HIP 10/17/2008    Past Surgical History:  Procedure Laterality Date  . ABDOMINAL HYSTERECTOMY    . APPENDECTOMY    . CATARACT EXTRACTION    . CHOLECYSTECTOMY    . THROAT SURGERY       OB History    Gravida  3   Para  3   Term      Preterm      AB      Living  3     SAB      TAB      Ectopic      Multiple      Live Births  3            Home Medications    Prior to Admission medications   Medication Sig Start Date End Date Taking? Authorizing Provider  ALPRAZolam Duanne Moron) 0.5 MG tablet Take 0.5 mg by mouth at bedtime.    [provider]  amLODipine (NORVASC) 5 MG tablet  05/04/16   [provider]  esomeprazole (NEXIUM) 40 MG capsule Take by mouth.    [provider]  levothyroxine (SYNTHROID, LEVOTHROID) 50 MCG tablet Take 50 mcg by mouth daily.    [provider]  naproxen sodium (ALEVE) 220 MG tablet Take 220 mg by mouth 2 (two) times daily as needed (pain).    [provider]  sulfamethoxazole-trimethoprim (BACTRIM DS) 800-160 MG tablet Take 1 tablet by  mouth 2 (two) times daily. Take 1 bid 12/14/18   Estill Dooms, NP    Family History Family History  Problem Relation Age of Onset  . Heart failure Mother   . Cancer Father        Bladder  . COPD Brother   . Heart disease Sister   . COPD Brother     Social History Social History   Tobacco Use  . Smoking status: Current Every Day Smoker    Packs/day: 0.50    Types: Cigarettes  . Smokeless tobacco: Never Used  Substance Use Topics  . Alcohol use: Yes    Comment: glass of wine occasionally   . Drug use: No     Allergies   Keflex [cephalexin] and Penicillins   Review of Systems Review of Systems  All other systems reviewed and are negative.    Physical Exam Updated Vital Signs BP (!) 109/50 (BP Location: Left Arm)   Pulse 98   Temp 98.3 F (36.8 C) (Oral)   Resp 20   Ht 1.753 m (5\' 9" )   Wt 78.9 kg   SpO2 95%   BMI 25.70  kg/m   Physical Exam Vitals signs and nursing note reviewed.  Constitutional:      General: She is in acute distress.     Appearance: She is well-developed. She is ill-appearing.  HENT:     Head: Normocephalic and atraumatic.     Mouth/Throat:     Pharynx: No oropharyngeal exudate.  Eyes:     General: No scleral icterus.       Right eye: No discharge.        Left eye: No discharge.     Conjunctiva/sclera: Conjunctivae normal.     Pupils: Pupils are equal, round, and reactive to light.  Neck:     Musculoskeletal: Normal range of motion and neck supple.     Thyroid: No thyromegaly.     Vascular: No JVD.  Cardiovascular:     Rate and Rhythm: Regular rhythm. Tachycardia present.     Heart sounds: Normal heart sounds. No murmur. No friction rub. No gallop.   Pulmonary:     Effort: Respiratory distress present.     Breath sounds: No wheezing or rales.     Comments: Diminished breath sounds bilaterally, prolonged expiratory phase, increased work of breathing, speaks in shortened sentences, oxygen of 78% on room air Abdominal:     General: Bowel sounds are normal. There is no distension.     Palpations: Abdomen is soft. There is no mass.     Tenderness: There is no abdominal tenderness.  Musculoskeletal: Normal range of motion.        General: No tenderness.  Lymphadenopathy:     Cervical: No cervical adenopathy.  Skin:    General: Skin is warm and dry.     Findings: No erythema or rash.  Neurological:     Mental Status: She is alert.     Coordination: Coordination normal.     Comments: The patient's gait is somewhat unsteady as she walks towards the back but has a normal exam in a supine position  Psychiatric:        Behavior: Behavior normal.     Comments: Intermittently tearful when describing her husband      ED Treatments / Results  Labs (all labs ordered are listed, but only abnormal results are displayed) Labs Reviewed  CBC WITH DIFFERENTIAL/PLATELET - Abnormal;  Notable for the following components:  Result Value   WBC 11.6 (*)    Neutro Abs 7.8 (*)    Monocytes Absolute 1.8 (*)    All other components within normal limits  COMPREHENSIVE METABOLIC PANEL - Abnormal; Notable for the following components:   Glucose, Bld 115 (*)    Calcium 8.6 (*)    Total Protein 6.3 (*)    Albumin 2.9 (*)    AST 50 (*)    Total Bilirubin 1.3 (*)    All other components within normal limits  D-DIMER, QUANTITATIVE (NOT AT [facility]) - Abnormal; Notable for the following components:   D-Dimer, Quant 2.73 (*)    All other components within normal limits  FIBRINOGEN - Abnormal; Notable for the following components:   Fibrinogen >800 (*)    All other components within normal limits  TROPONIN I (HIGH SENSITIVITY) - Abnormal; Notable for the following components:   Troponin I (High Sensitivity) 23 (*)    All other components within normal limits  CULTURE, BLOOD (ROUTINE X 2)  CULTURE, BLOOD (ROUTINE X 2)  URINE CULTURE  SARS CORONAVIRUS 2 BY RT PCR (HOSPITAL ORDER, [place] [facility])  LACTIC ACID, PLASMA  PROCALCITONIN  LACTATE DEHYDROGENASE  TRIGLYCERIDES  APTT  PROTIME-INR  BRAIN NATRIURETIC PEPTIDE  LACTIC ACID, PLASMA  FERRITIN  C-REACTIVE PROTEIN  URINALYSIS, ROUTINE W REFLEX MICROSCOPIC  POC SARS CORONAVIRUS 2 AG -  ED    EKG EKG Interpretation  Date/Time:  Wednesday February 07 2019 06:14:05 EST Ventricular Rate:  115 PR Interval:    QRS Duration: 91 QT Interval:  332 QTC Calculation: 460 R Axis:   87 Text Interpretation: Sinus tachycardia Borderline right axis deviation Borderline ST depression, diffuse leads Since last tracing rate faster Confirmed by Noemi Chapel [phone]) on 02/07/2019 6:56:09 AM   Radiology Dg Chest Port 1 View  Result Date: 02/07/2019 CLINICAL DATA:  Cough and shortness of breath. EXAM: PORTABLE CHEST 1 VIEW COMPARISON:  Two-view chest x-ray 09/22/2018 FINDINGS: Heart size is normal. Asymmetric  right basilar airspace disease is present with some volume loss. Left lung is clear. Atherosclerotic changes are again noted at the aortic arch. IMPRESSION: 1. Asymmetric right lower lobe airspace disease with volume loss is concerning for pneumonia. 2. Aortic atherosclerosis. 3. Chronic changes of COPD. Electronically Signed   By: San Morelle M.D.   On: 02/07/2019 06:49    Procedures .Critical Care Performed by: Noemi Chapel, MD Authorized by: Noemi Chapel, MD   Critical care provider statement:    Critical care time (minutes):  35   Critical care time was exclusive of:  Separately billable procedures and treating other patients and teaching time   Critical care was necessary to treat or prevent imminent or life-threatening deterioration of the following conditions:  Respiratory failure   Critical care was time spent personally by me on the following activities:  Blood draw for specimens, development of treatment plan with patient or surrogate, discussions with consultants, evaluation of patient's response to treatment, examination of patient, obtaining history from patient or surrogate, ordering and performing treatments and interventions, ordering and review of laboratory studies, ordering and review of radiographic studies, pulse oximetry, re-evaluation of patient's condition and review of old charts   (including critical care time)  Medications Ordered in ED Medications  methylPREDNISolone sodium succinate (SOLU-MEDROL) 125 mg/2 mL injection 125 mg (has no administration in time range)  levofloxacin (LEVAQUIN) IVPB 750 mg (has no administration in time range)  albuterol (VENTOLIN HFA) 108 (90 Base) MCG/ACT inhaler  2 puff (2 puffs Inhalation Given 02/07/19 0637)  aerochamber Z-Stat Plus/medium 1 each (1 each Other Given 02/07/19 [id number])     Initial Impression / Assessment and Plan / ED Course  I have reviewed the triage vital signs and the nursing notes.  Pertinent labs &  imaging results that were available during my care of the patient were reviewed by me and considered in my medical decision making (see chart for details).        The patient appears ill, she is tachycardic and hypoxic, she has no peripheral edema, she will need further evaluation for the cause of her shortness of breath including possible pneumonia, congestive heart failure, cardiac disease, pulmonary embolism, coronavirus.  The patient is agreeable, she is requiring oxygen by nasal cannula at 3 L which gets her over 92%.  We will also give albuterol treatments as she has decreased lung sounds consistent with possible obstructive pathology  I discussed the case with Dr. Manuella Ghazi of the hospitalist service was agreed to admit the patient to the hospital, she is a patient under investigation high risk for coronavirus  There is however a slight elevation in the white blood cell count and a possible infiltrate in the right lower lobe, she is requiring oxygen and antibiotics, the patient will be treated for possible sepsis and respiratory failure, she is critically ill.   Levaquin  Solu-Medrol  Albuterol  Supplemental oxygen for hypoxia less than 80% on room air  Linda Williamson was evaluated in Emergency Department on 02/07/2019 for the symptoms described in the history of present illness. She was evaluated in the context of the global COVID-19 pandemic, which necessitated consideration that the patient might be at risk for infection with the SARS-CoV-2 virus that causes COVID-19. Institutional protocols and algorithms that pertain to the evaluation of patients at risk for COVID-19 are in a state of rapid change based on information released by regulatory bodies including the CDC and federal and state organizations. These policies and algorithms were followed during the patient's care in the ED.   Final Clinical Impressions(s) / ED Diagnoses   Final diagnoses:  Respiratory distress     Noemi Chapel, MD 02/07/19 [phone]

## 2019-02-07 NOTE — Plan of Care (Signed)

## 2019-02-07 NOTE — Progress Notes (Signed)
Pharmacy Antibiotic Note  Linda Williamson is a 84 y.o. female admitted on 02/07/2019 with CAP.  Pharmacy has been consulted for Levaquin dosing.  Plan: Levaquin 750 mg IV every 24 hours.  Monitor labs, c/s, and patient improvement.  Height: 5\' 9"  (175.3 cm) Weight: 174 lb (78.9 kg) IBW/kg (Calculated) : 66.2  Temp (24hrs), Avg:98.3 F (36.8 C), Min:98.2 F (36.8 C), Max:98.3 F (36.8 C)  Recent Labs  Lab 02/07/19 0623 02/07/19 0852  WBC 11.6*  --   CREATININE 0.59  --   LATICACIDVEN 1.2 1.3    Estimated Creatinine Clearance: 56.7 mL/min (by C-G formula based on SCr of 0.59 mg/dL).    Allergies  Allergen Reactions  . Keflex [Cephalexin]   . Penicillins Rash    Antimicrobials this admission: Levaquin 12/2  >>     Dose adjustments this admission: N/A  Microbiology results: 12/2 BCx: ngtd 12/2 UCx: pending    Thank you for allowing pharmacy to be a part of this patient's care.  Ramond Craver 02/07/2019 12:18 PM

## 2019-02-07 NOTE — ED Notes (Signed)
Called and spoke with daughters and gave them an update per pt request.

## 2019-02-08 LAB — BASIC METABOLIC PANEL
Anion gap: 11 (ref 5–15)
BUN: 14 mg/dL (ref 8–23)
CO2: 26 mmol/L (ref 22–32)
Calcium: 9.2 mg/dL (ref 8.9–10.3)
Chloride: 100 mmol/L (ref 98–111)
Creatinine, Ser: 0.6 mg/dL (ref 0.44–1.00)
GFR calc Af Amer: >60 mL/min (ref >60)
GFR calc non Af Amer: >60 mL/min (ref >60)
Glucose, Bld: 144 mg/dL — ABNORMAL HIGH (ref 70–99)
Potassium: 4.3 mmol/L (ref 3.5–5.1)
Sodium: 137 mmol/L (ref 135–145)

## 2019-02-08 LAB — URINALYSIS, ROUTINE W REFLEX MICROSCOPIC
Bilirubin Urine: NEGATIVE
Glucose, UA: NEGATIVE mg/dL
Hgb urine dipstick: NEGATIVE
Ketones, ur: NEGATIVE mg/dL
Nitrite: NEGATIVE
Protein, ur: NEGATIVE mg/dL
Specific Gravity, Urine: 1.014 (ref 1.005–1.030)
pH: 6 (ref 5.0–8.0)

## 2019-02-08 LAB — CBC
HCT: 38.4 % (ref 36.0–46.0)
Hemoglobin: 12.1 g/dL (ref 12.0–15.0)
MCH: 30.6 pg (ref 26.0–34.0)
MCHC: 31.5 g/dL (ref 30.0–36.0)
MCV: 97 fL (ref 80.0–100.0)
Platelets: 246 10*3/uL (ref 150–400)
RBC: 3.96 MIL/uL (ref 3.87–5.11)
RDW: 12.8 % (ref 11.5–15.5)
WBC: 10.2 10*3/uL (ref 4.0–10.5)
nRBC: 0 % (ref 0.0–0.2)

## 2019-02-08 LAB — NOVEL CORONAVIRUS, NAA: SARS-CoV-2, NAA: NOT DETECTED

## 2019-02-08 LAB — MAGNESIUM: Magnesium: 2.3 mg/dL (ref 1.7–2.4)

## 2019-02-08 NOTE — Progress Notes (Signed)
PROGRESS NOTE    Linda Williamson  [id number] DOB: Sep 23, 1934 DOA: 02/07/2019 PCP: Jake Samples, PA-C   Brief Narrative:  Per HPI: Linda Williamson is a 84 y.o. female with medical history significant for prior throat cancer, active tobacco abuse with likely COPD, hypertension, hypothyroidism, anxiety disorder, and GERD who presented to the emergency department with progressive shortness of breath as well as dry cough that began 1 week ago.  This was shortly after she buried her husband who died from complications of multiple types of cancer.  She states that she was outside in the cold and rain and figured that she may have developed a viral illness.  She denies any chest pain, fevers, chills, or swelling in her legs.  She also denies any abdominal pain, nausea, vomiting, urinary frequency, or urgency.  Both she and her sister were tested for Covid at a drive-through testing facility yesterday, but do not have results as of yet.  She currently describes some wheezing and chest tightness which have improved after treatment in the ED.  12/3: Patient was admitted with acute hypoxemic respiratory failure secondary to COPD exacerbation and appears to be doing better today, but still remains on 5 L nasal cannula with some ongoing wheezing.  She continues to have poor breath sounds at her bilateral bases.  Plan to wean oxygen today and continue current treatments.  Assessment & Plan:   Active Problems:   COPD with acute exacerbation (HCC)   Acute hypoxemic respiratory failure secondary to COPD exacerbation-persistent -Plan to wean oxygen as tolerated and use incentive spirometry -Up in chair with ambulation -Pulmicort twice daily -DuoNebs Williamson 6 hours -Solu-Medrol 40 mg IV twice daily -May need to go home with 1-2 L [place] by tomorrow  Right-sided multifocal pneumonia -Antitussives -Monitor cultures -Check urine strep pneumonia and Legionella pending -Continue on IV Levaquin  daily  History of tobacco abuse and prior throat cancer -Counseled on cessation  Hypertension -Continue home amlodipine; currently controlled  Hypothyroidism -Continue home medication  GERD -Continue PPI  Anxiety disorder -Continue Xanax   DVT prophylaxis: Lovenox Code Status: Full Family Communication: Discussed with sister at bedside on 12/2 Disposition Plan: Continue COPD as well as pneumonia treatment with anticipated discharge hopefully in the next 1 to 2 days if stable and improved.   Consultants:   None  Procedures:   None  Antimicrobials:  Anti-infectives (From admission, onward)   Start     Dose/Rate Route Frequency Ordered Stop   02/08/19 0800  levofloxacin (LEVAQUIN) IVPB 750 mg     750 mg 100 mL/hr over 90 Minutes Intravenous Williamson 24 hours 02/07/19 1223     02/07/19 0730  levofloxacin (LEVAQUIN) IVPB 750 mg     750 mg 100 mL/hr over 90 Minutes Intravenous  Once 02/07/19 0719 02/07/19 0924       Subjective: Patient seen and evaluated today and appears to be improved.  She continues to have some shortness of breath and wheezing, however and still remains on 5 L nasal cannula oxygen.  Objective: Vitals:   02/07/19 2129 02/08/19 0412 02/08/19 0846 02/08/19 0849  BP: (!) 119/55 133/61    Pulse: (!) 106 (!) 109    Resp: 20     Temp: 98.2 F (36.8 C) 97.7 F (36.5 C)    TempSrc: Oral Oral    SpO2: 98% 97% 92% 97%  Weight:      Height:        Intake/Output Summary (Last 24 hours) at 02/08/2019  [place] filed at 02/08/2019 0139 Gross per 24 hour  Intake 480 ml  Output 600 ml  Net -120 ml   Filed Weights   02/07/19 [id number]  Weight: 78.9 kg    Examination:  General exam: Appears calm and comfortable  Respiratory system: Bilateral wheezing with diminished breath sounds on the right side.  Respiratory effort is normal.  5 L nasal cannula oxygen noted. Cardiovascular system: S1 & S2 heard, RRR. No JVD, murmurs, rubs, gallops or  clicks. No pedal edema. Gastrointestinal system: Abdomen is nondistended, soft and nontender. No organomegaly or masses felt. Normal bowel sounds heard. Central nervous system: Alert and oriented. No focal neurological deficits. Extremities: Symmetric 5 x 5 power. Skin: No rashes, lesions or ulcers Psychiatry: Judgement and insight appear normal. Mood & affect appropriate.     Data Reviewed: I have personally reviewed following labs and imaging studies  CBC: Recent Labs  Lab 02/07/19 0623 02/08/19 0551  WBC 11.6* 10.2  NEUTROABS 7.8*  --   HGB 12.6 12.1  HCT 39.4 38.4  MCV 96.8 97.0  PLT 212 0000000   Basic Metabolic Panel: Recent Labs  Lab 02/07/19 0623 02/08/19 0551  NA 137 137  K 3.5 4.3  CL 100 100  CO2 27 26  GLUCOSE 115* 144*  BUN 11 14  CREATININE 0.59 0.60  CALCIUM 8.6* 9.2  MG  --  2.3   GFR: Estimated Creatinine Clearance: 56.7 mL/min (by C-G formula based on SCr of 0.6 mg/dL). Liver Function Tests: Recent Labs  Lab 02/07/19 0623  AST 50*  ALT 40  ALKPHOS 62  BILITOT 1.3*  PROT 6.3*  ALBUMIN 2.9*   No results for input(s): LIPASE, AMYLASE in the last 168 hours. No results for input(s): AMMONIA in the last 168 hours. Coagulation Profile: Recent Labs  Lab 02/07/19 0623  INR 1.1   Cardiac Enzymes: No results for input(s): CKTOTAL, CKMB, CKMBINDEX, TROPONINI in the last 168 hours. BNP (last 3 results) No results for input(s): PROBNP in the last 8760 hours. HbA1C: No results for input(s): HGBA1C in the last 72 hours. CBG: No results for input(s): GLUCAP in the last 168 hours. Lipid Profile: Recent Labs    02/07/19 0623  TRIG 63   Thyroid Function Tests: Recent Labs    02/07/19 1234  TSH 1.909   Anemia Panel: Recent Labs    02/07/19 0623  FERRITIN 114   Sepsis Labs: Recent Labs  Lab 02/07/19 0623 02/07/19 0852  PROCALCITON 0.10  --   LATICACIDVEN 1.2 1.3    Recent Results (from the past 240 hour(s))  Blood Culture (routine  x 2)     Status: None (Preliminary result)   Collection Time: 02/07/19  6:22 AM   Specimen: BLOOD RIGHT ARM  Result Value Ref Range Status   Specimen Description BLOOD RIGHT ARM  Final   Special Requests   Final    BOTTLES DRAWN AEROBIC AND ANAEROBIC Blood Culture adequate volume   Culture   Final    NO GROWTH 1 DAY Performed at [facility], [address], [place], [place] [address]    Report Status PENDING  Incomplete  Blood Culture (routine x 2)     Status: None (Preliminary result)   Collection Time: 02/07/19  6:32 AM   Specimen: Left Antecubital; Blood  Result Value Ref Range Status   Specimen Description LEFT ANTECUBITAL  Final   Special Requests   Final    BOTTLES DRAWN AEROBIC AND ANAEROBIC Blood Culture adequate volume  Culture   Final    NO GROWTH 1 DAY Performed at [facility], [address] [place], [place] [address]    Report Status PENDING  Incomplete  SARS Coronavirus 2 by RT PCR (hospital order, performed in [facility] hospital lab) Nasopharyngeal Nasopharyngeal Swab     Status: None   Collection Time: 02/07/19  6:50 AM   Specimen: Nasopharyngeal Swab  Result Value Ref Range Status   SARS Coronavirus 2 NEGATIVE NEGATIVE Final    Comment: (NOTE) SARS-CoV-2 target nucleic acids are NOT DETECTED. The SARS-CoV-2 RNA is generally detectable in upper and lower respiratory specimens during the acute phase of infection. The lowest concentration of SARS-CoV-2 viral copies this assay can detect is 250 copies / mL. A negative result does not preclude SARS-CoV-2 infection and should not be used as the sole basis for treatment or other patient management decisions.  A negative result may occur with improper specimen collection / handling, submission of specimen other than nasopharyngeal swab, presence of viral mutation(s) within the areas targeted by this assay, and inadequate number of viral copies (<250 copies / mL). A negative result must be combined with  clinical observations, patient history, and epidemiological information. Fact Sheet for Patients:   StrictlyIdeas.no Fact Sheet for Healthcare Providers: BankingDealers.co.za This test is not yet approved or cleared  by the [place] FDA and has been authorized for detection and/or diagnosis of SARS-CoV-2 by FDA under an Emergency Use Authorization (EUA).  This EUA will remain in effect (meaning this test can be used) for the duration of the COVID-19 declaration under Section 564(b)(1) of the Act, 21 U.S.C. section 360bbb-3(b)(1), unless the authorization is terminated or revoked sooner. Performed at [facility], [address] [place], [place] [address]          Radiology Studies: Ct Angio Chest Pe W Or Wo Contrast  Result Date: 02/07/2019 CLINICAL DATA:  Shortness of breath.  History of throat carcinoma EXAM: CT ANGIOGRAPHY CHEST WITH CONTRAST TECHNIQUE: Multidetector CT imaging of the chest was performed using the standard protocol during bolus administration of intravenous contrast. Multiplanar CT image reconstructions and MIPs were obtained to evaluate the vascular anatomy. CONTRAST:  17mL OMNIPAQUE IOHEXOL 350 MG/ML SOLN COMPARISON:  Chest radiograph February 07, 2019 FINDINGS: Cardiovascular: There is no demonstrable pulmonary embolus. There is no thoracic aortic aneurysm or dissection. Visualized great vessels show multiple foci calcification with moderately severe narrowing of the origin of the left subclavian artery. There is extensive aortic atherosclerosis. There are foci of coronary artery calcification. There is no pericardial effusion or pericardial thickening. Mediastinum/Nodes: Thyroid appears unremarkable. There are several subcentimeter mediastinal lymph nodes. There is a lymph node anterior to the carina measuring 1.5 x 1.5 cm. A lymph node anterior to the left lower trachea measures 1.0 x 1.0 cm. No esophageal lesions  are appreciable. Lungs/Pleura: There is airspace consolidation in the the medial segment of the right middle lobe. There is also an area of consolidation abutting the right hemidiaphragm in the anterior segment right lower lobe. There are ill-defined areas of patchy airspace opacity scattered throughout the lungs bilaterally, more severe on the right than on the left. There is a degree of underlying centrilobular emphysematous change. No pleural effusions are evident. There is scarring in the apices, asymmetric on the right compared to the left. Upper Abdomen: There is hepatic steatosis. Gallbladder is absent. There is extensive aortic atherosclerosis in the visualized upper abdominal aorta. Musculoskeletal: There are no blastic or lytic bone lesions. No  chest wall lesions are evident. Review of the MIP images confirms the above findings. IMPRESSION: 1.  No demonstrable pulmonary embolus. 2. Extensive aortic atherosclerosis as well as foci of great vessel and coronary artery calcification. No thoracic aortic aneurysm or dissection. 3. Underlying centrilobular emphysematous change. Multifocal pneumonia with consolidation most notably in the medial segment right middle lobe and anterior right base regions. More patchy infiltrate elsewhere. Question underlying atypical organism pneumonia. Scattered areas of scarring as well. 4. Prominent pericarinal region lymph nodes which may be reactive due to the parenchymal lung changes but potentially could have neoplastic etiology given history of throat carcinoma. 5.  Hepatic steatosis. 6.  Gallbladder absent. Aortic Atherosclerosis (ICD10-I70.0) and Emphysema (ICD10-J43.9). Electronically Signed   By: Lowella Grip III M.D.   On: 02/07/2019 09:01   Dg Chest Port 1 View  Result Date: 02/07/2019 CLINICAL DATA:  Cough and shortness of breath. EXAM: PORTABLE CHEST 1 VIEW COMPARISON:  Two-view chest x-ray 09/22/2018 FINDINGS: Heart size is normal. Asymmetric right basilar  airspace disease is present with some volume loss. Left lung is clear. Atherosclerotic changes are again noted at the aortic arch. IMPRESSION: 1. Asymmetric right lower lobe airspace disease with volume loss is concerning for pneumonia. 2. Aortic atherosclerosis. 3. Chronic changes of COPD. Electronically Signed   By: San Morelle M.D.   On: 02/07/2019 06:49        Scheduled Meds:  ALPRAZolam  0.5 mg Oral QHS   amLODipine  5 mg Oral Daily   budesonide (PULMICORT) nebulizer solution  0.25 mg Nebulization BID   enoxaparin (LOVENOX) injection  40 mg Subcutaneous Q24H   levalbuterol  0.63 mg Nebulization Q6H   levothyroxine  50 mcg Oral Daily   methylPREDNISolone (SOLU-MEDROL) injection  40 mg Intravenous Q12H   nicotine  7 mg Transdermal Daily   pantoprazole  40 mg Oral Daily   sodium chloride flush  3 mL Intravenous Q12H   Continuous Infusions:  sodium chloride     levofloxacin (LEVAQUIN) IV 750 mg (02/08/19 0901)     LOS: 1 day    Time spent: 30 minutes    Antonella Upson Darleen Crocker, DO [facility] Pager [phone]  If 7PM-7AM, please contact night-coverage [email or web address] Password [email or web address] 02/08/2019, 10:23 AM

## 2019-02-09 LAB — STREP PNEUMONIAE URINARY ANTIGEN: Strep Pneumo Urinary Antigen: NEGATIVE

## 2019-02-09 MED ORDER — LEVOFLOXACIN 750 MG PO TABS: 750.0000 mg | ORAL_TABLET | Freq: Every day | ORAL | 0 refills | Status: AC

## 2019-02-09 MED ORDER — NICOTINE 7 MG/24HR TD PT24: 7.0000 mg | MEDICATED_PATCH | Freq: Every day | TRANSDERMAL | 0 refills | Status: DC

## 2019-02-09 MED ORDER — PREDNISONE 20 MG PO TABS: 40.0000 mg | ORAL_TABLET | Freq: Every day | ORAL | 0 refills | Status: AC

## 2019-02-09 MED ORDER — ALBUTEROL SULFATE HFA 108 (90 BASE) MCG/ACT IN AERS: 2.0000 | INHALATION_SPRAY | Freq: Four times a day (QID) | RESPIRATORY_TRACT | 2 refills | Status: DC | PRN

## 2019-02-09 MED ORDER — LEVOTHYROXINE SODIUM 75 MCG PO TABS: 75.0000 ug | ORAL_TABLET | Freq: Every day | ORAL | 2 refills | Status: DC

## 2019-02-09 NOTE — Progress Notes (Signed)
Nsg Discharge Note  Admit Date:  02/07/2019 Discharge date: 02/09/2019   [name] to be D/C'd home per MD order.  AVS completed.  Copy for chart, and copy for patient signed, and dated. Patient/caregiver able to verbalize understanding.  Discharge Medication: Allergies as of 02/09/2019      Reactions   Keflex [cephalexin]    Penicillins Rash      Medication List    STOP taking these medications   sulfamethoxazole-trimethoprim 800-160 MG tablet Commonly known as: BACTRIM DS     TAKE these medications   albuterol 108 (90 Base) MCG/ACT inhaler Commonly known as: VENTOLIN HFA Inhale 2 puffs into the lungs every 6 (six) hours as needed for wheezing or shortness of breath.   ALPRAZolam 0.5 MG tablet Commonly known as: XANAX Take 0.5 mg by mouth at bedtime.   amLODipine 5 MG tablet Commonly known as: NORVASC Take 5 mg by mouth daily.   esomeprazole 40 MG capsule Commonly known as: NEXIUM Take 40 mg by mouth daily.   fluticasone 50 MCG/ACT nasal spray Commonly known as: FLONASE Place 2 sprays into both nostrils daily.   levofloxacin 750 MG tablet Commonly known as: Levaquin Take 1 tablet (750 mg total) by mouth daily for 2 days.   levothyroxine 75 MCG tablet Commonly known as: SYNTHROID Take 1 tablet (75 mcg total) by mouth daily.   naproxen sodium 220 MG tablet Commonly known as: ALEVE Take 220 mg by mouth 2 (two) times daily as needed (pain).   nicotine 7 mg/24hr patch Commonly known as: NICODERM CQ - dosed in mg/24 hr Place 1 patch (7 mg total) onto the skin daily. Start taking on: February 10, 2019   nystatin cream Commonly known as: MYCOSTATIN Apply 1 application topically 2 (two) times daily.   predniSONE 20 MG tablet Commonly known as: Deltasone Take 2 tablets (40 mg total) by mouth daily for 5 days.            Durable Medical Equipment  (From admission, onward)         Start     Ordered   02/09/19 0952  For home use only DME oxygen   Once    Question Answer Comment  Length of Need Lifetime   Mode or (Route) Nasal cannula   Liters per Minute 2   Frequency Continuous (stationary and portable oxygen unit needed)   Oxygen conserving device Yes   Oxygen delivery system Gas      02/09/19 0951          Discharge Assessment: Vitals:   02/09/19 0915 02/09/19 0934  BP:    Pulse:    Resp:    Temp:    SpO2: (!) 85% 92%   Skin clean, dry and intact without evidence of skin break down, no evidence of skin tears noted. IV catheter discontinued intact. Site without signs and symptoms of complications - no redness or edema noted at insertion site, patient denies c/o pain - only slight tenderness at site.  Dressing with slight pressure applied.  D/c Instructions-Education: Discharge instructions given to patient/family with verbalized understanding. D/c education completed with patient/family including follow up instructions, medication list, d/c activities limitations if indicated, with other d/c instructions as indicated by MD - patient able to verbalize understanding, all questions fully answered. Patient instructed to return to ED, call 911, or call MD for any changes in condition.  Patient escorted via [place], and D/C home via private auto.  Carney Corners, RN 02/09/2019 1:09  PM

## 2019-02-09 NOTE — Progress Notes (Signed)
SATURATION QUALIFICATIONS: (This note is used to comply with regulatory documentation for home oxygen)  Patient Saturations on Room Air at Rest = 87%  Patient Saturations on Room Air while Ambulating = 85%  Patient Saturations on 2 Liters of oxygen while Ambulating = 92%  Please briefly explain why patient needs home oxygen: Pt desat's when ambulating without oxygen.

## 2019-02-09 NOTE — TOC Transition Note (Signed)
Transition of Care [facility]) - CM/SW Discharge Note   Patient Details  Name: Linda Williamson MRN: [id number] Date of Birth: August 26, 1934  [facility]) CM/SW Contact:  Bandy Honaker, Chauncey Reading, RN Phone Number: 02/09/2019, 9:58 AM   Clinical Narrative:   Patient discharging home today, qualifies for oxygen. Elects [employer]. Portable tank will be delivered to room prior to DC. Daughter to transport home today.     Final next level of care: Home/Self Care Barriers to Discharge: Barriers Resolved   Patient Goals and CMS Choice Patient states their goals for this hospitalization and ongoing recovery are:: return home CMS Medicare.gov Compare Post Acute Care list provided to:: Patient Choice offered to / list presented to : Patient     Discharge Plan and Services   Discharge Planning Services: CM Consult            DME Arranged: Oxygen DME Agency: [facility] Date DME Agency Contacted: 02/09/19 Time DME Agency Contacted: [phone] Representative spoke with at DME Agency: Juliann Pulse of [facility]            Social Determinants of Health ([place]) Interventions     Readmission Risk Interventions No flowsheet data found.

## 2019-02-09 NOTE — Discharge Summary (Signed)
Physician Discharge Summary  Linda Williamson [id number] DOB: January 27, 1935 DOA: 02/07/2019  PCP: Jake Samples, PA-C  Admit date: 02/07/2019  Discharge date: 02/09/2019  Admitted From:Home  Disposition:  Home  Recommendations for Outpatient Follow-up:  1. Follow up with PCP in 1-2 weeks 2. Continue on prednisone for 5 more days as prescribed and use albuterol as needed for shortness of breath or wheezing 3. Please work on tobacco use cessation and use nicotine patch 4. Levothyroxine refilled 5. Continue on Levaquin as prescribed for 2 more days to complete course of treatment  Home Health: None  Equipment/Devices: We will need 2L home nasal cannula oxygen  Discharge Condition: Stable  CODE STATUS: Full  Diet recommendation: Heart Healthy  Brief/Interim Summary: Per HPI: Linda Williamson a 84 y.o.femalewith medical history significant forprior throat cancer, active tobacco abuse with likely COPD, hypertension, hypothyroidism, anxiety disorder, and GERD who presented to the emergency department with progressive shortness of breath as well as dry cough that began 1 week ago. This was shortly after she buried her husband who died from complications of multiple types of cancer. She states that she was outside in the cold and rain and figured that she may have developed a viral illness. She denies any chest pain, fevers, chills, or swelling in her legs. She also denies any abdominal pain, nausea, vomiting, urinary frequency, or urgency. Both she and her sister were tested for Covid at a drive-through testing facility yesterday, but do not have results as of yet. She currently describes some wheezing and chest tightness which have improved after treatment in the ED.  12/3: Patient was admitted with acute hypoxemic respiratory failure secondary to COPD exacerbation and appears to be doing better today, but still remains on 5 L nasal cannula with some ongoing wheezing.  She  continues to have poor breath sounds at her bilateral bases.  Plan to wean oxygen today and continue current treatments.  12/4: Patient is overall doing much better this morning, however she still remains on 2-3 L nasal cannula oxygen.  She was ambulated and noted to require 2 L nasal cannula oxygen and will be discharged with this.  She will remain on prednisone for 5 more days to complete course of steroid taper as well as Levaquin for 2 more days to complete 5-day course of treatment for her right-sided multifocal pneumonia.  Additionally she will be given inhaler as needed for shortness of breath or wheezing.  No other significant issues have been noted throughout the course of this admission.  She will work on tobacco cessation at home and has been given nicotine patches.  Discharge Diagnoses:  Active Problems:   COPD with acute exacerbation ([place])  Principal discharge diagnosis: Acute hypoxemic respiratory failure secondary to COPD exacerbation in the setting of right-sided multifocal pneumonia.  Discharge Instructions  Discharge Instructions    Diet - low sodium heart healthy   Complete by: As directed    Increase activity slowly   Complete by: As directed      Allergies as of 02/09/2019      Reactions   Keflex [cephalexin]    Penicillins Rash      Medication List    STOP taking these medications   sulfamethoxazole-trimethoprim 800-160 MG tablet Commonly known as: BACTRIM DS     TAKE these medications   albuterol 108 (90 Base) MCG/ACT inhaler Commonly known as: VENTOLIN HFA Inhale 2 puffs into the lungs every 6 (six) hours as needed for wheezing or shortness of  breath.   ALPRAZolam 0.5 MG tablet Commonly known as: XANAX Take 0.5 mg by mouth at bedtime.   amLODipine 5 MG tablet Commonly known as: NORVASC Take 5 mg by mouth daily.   esomeprazole 40 MG capsule Commonly known as: NEXIUM Take 40 mg by mouth daily.   fluticasone 50 MCG/ACT nasal spray Commonly known  as: FLONASE Place 2 sprays into both nostrils daily.   levofloxacin 750 MG tablet Commonly known as: Levaquin Take 1 tablet (750 mg total) by mouth daily for 2 days.   levothyroxine 75 MCG tablet Commonly known as: SYNTHROID Take 1 tablet (75 mcg total) by mouth daily.   naproxen sodium 220 MG tablet Commonly known as: ALEVE Take 220 mg by mouth 2 (two) times daily as needed (pain).   nicotine 7 mg/24hr patch Commonly known as: NICODERM CQ - dosed in mg/24 hr Place 1 patch (7 mg total) onto the skin daily. Start taking on: February 10, 2019   nystatin cream Commonly known as: MYCOSTATIN Apply 1 application topically 2 (two) times daily.   predniSONE 20 MG tablet Commonly known as: Deltasone Take 2 tablets (40 mg total) by mouth daily for 5 days.            Durable Medical Equipment  (From admission, onward)         Start     Ordered   02/09/19 0952  For home use only DME oxygen  Once    Question Answer Comment  Length of Need Lifetime   Mode or (Route) Nasal cannula   Liters per Minute 2   Frequency Continuous (stationary and portable oxygen unit needed)   Oxygen conserving device Yes   Oxygen delivery system Gas      02/09/19 0951         Follow-up Information    Jake Samples, PA-C Follow up in 1 week(s).   Specialty: Family Medicine Contact information: [address] [place] [id number] [phone]          Allergies  Allergen Reactions  . Keflex [Cephalexin]   . Penicillins Rash    Consultations:  None   Procedures/Studies: Ct Angio Chest Pe W Or Wo Contrast  Result Date: 02/07/2019 CLINICAL DATA:  Shortness of breath.  History of throat carcinoma EXAM: CT ANGIOGRAPHY CHEST WITH CONTRAST TECHNIQUE: Multidetector CT imaging of the chest was performed using the standard protocol during bolus administration of intravenous contrast. Multiplanar CT image reconstructions and MIPs were obtained to evaluate the vascular  anatomy. CONTRAST:  166mL OMNIPAQUE IOHEXOL 350 MG/ML SOLN COMPARISON:  Chest radiograph February 07, 2019 FINDINGS: Cardiovascular: There is no demonstrable pulmonary embolus. There is no thoracic aortic aneurysm or dissection. Visualized great vessels show multiple foci calcification with moderately severe narrowing of the origin of the left subclavian artery. There is extensive aortic atherosclerosis. There are foci of coronary artery calcification. There is no pericardial effusion or pericardial thickening. Mediastinum/Nodes: Thyroid appears unremarkable. There are several subcentimeter mediastinal lymph nodes. There is a lymph node anterior to the carina measuring 1.5 x 1.5 cm. A lymph node anterior to the left lower trachea measures 1.0 x 1.0 cm. No esophageal lesions are appreciable. Lungs/Pleura: There is airspace consolidation in the the medial segment of the right middle lobe. There is also an area of consolidation abutting the right hemidiaphragm in the anterior segment right lower lobe. There are ill-defined areas of patchy airspace opacity scattered throughout the lungs bilaterally, more severe on the right than on the left. There  is a degree of underlying centrilobular emphysematous change. No pleural effusions are evident. There is scarring in the apices, asymmetric on the right compared to the left. Upper Abdomen: There is hepatic steatosis. Gallbladder is absent. There is extensive aortic atherosclerosis in the visualized upper abdominal aorta. Musculoskeletal: There are no blastic or lytic bone lesions. No chest wall lesions are evident. Review of the MIP images confirms the above findings. IMPRESSION: 1.  No demonstrable pulmonary embolus. 2. Extensive aortic atherosclerosis as well as foci of great vessel and coronary artery calcification. No thoracic aortic aneurysm or dissection. 3. Underlying centrilobular emphysematous change. Multifocal pneumonia with consolidation most notably in the medial  segment right middle lobe and anterior right base regions. More patchy infiltrate elsewhere. Question underlying atypical organism pneumonia. Scattered areas of scarring as well. 4. Prominent pericarinal region lymph nodes which may be reactive due to the parenchymal lung changes but potentially could have neoplastic etiology given history of throat carcinoma. 5.  Hepatic steatosis. 6.  Gallbladder absent. Aortic Atherosclerosis (ICD10-I70.0) and Emphysema (ICD10-J43.9). Electronically Signed   By: Lowella Grip III M.D.   On: 02/07/2019 09:01   Dg Chest Port 1 View  Result Date: 02/07/2019 CLINICAL DATA:  Cough and shortness of breath. EXAM: PORTABLE CHEST 1 VIEW COMPARISON:  Two-view chest x-ray 09/22/2018 FINDINGS: Heart size is normal. Asymmetric right basilar airspace disease is present with some volume loss. Left lung is clear. Atherosclerotic changes are again noted at the aortic arch. IMPRESSION: 1. Asymmetric right lower lobe airspace disease with volume loss is concerning for pneumonia. 2. Aortic atherosclerosis. 3. Chronic changes of COPD. Electronically Signed   By: San Morelle M.D.   On: 02/07/2019 06:49     Discharge Exam: Vitals:   02/09/19 0915 02/09/19 0934  BP:    Pulse:    Resp:    Temp:    SpO2: (!) 85% 92%   Vitals:   02/09/19 0752 02/09/19 0757 02/09/19 0915 02/09/19 0934  BP:      Pulse:      Resp:      Temp:      TempSrc:      SpO2: 92% 92% (!) 85% 92%  Weight:      Height:        General: Pt is alert, awake, not in acute distress Cardiovascular: RRR, S1/S2 +, no rubs, no gallops Respiratory: CTA bilaterally, no wheezing, no rhonchi, 2 L nasal cannula Abdominal: Soft, NT, ND, bowel sounds + Extremities: no edema, no cyanosis    The results of significant diagnostics from this hospitalization (including imaging, microbiology, ancillary and laboratory) are listed below for reference.     Microbiology: Recent Results (from the past 240  hour(s))  Novel Coronavirus, NAA ([facility])     Status: None   Collection Time: 02/06/19  2:33 PM   Specimen: Nasopharyngeal(NP) swabs in vial transport medium   NASOPHARYNGE  TESTING  Result Value Ref Range Status   SARS-CoV-2, NAA Not Detected Not Detected Final    Comment: This nucleic acid amplification test was developed and its performance characteristics determined by [employer]. Nucleic acid amplification tests include PCR and TMA. This test has not been FDA cleared or approved. This test has been authorized by FDA under an Emergency Use Authorization (EUA). This test is only authorized for the duration of time the declaration that circumstances exist justifying the authorization of the emergency use of in vitro diagnostic tests for detection of SARS-CoV-2 virus and/or diagnosis of COVID-19 infection under section  564(b)(1) of the Act, 21 U.S.C. [id number]) (1), unless the authorization is terminated or revoked sooner. When diagnostic testing is negative, the possibility of a false negative result should be considered in the context of a patient's recent exposures and the presence of clinical signs and symptoms consistent with COVID-19. An individual without symptoms of COVID-19 and who is not shedding SARS-CoV-2 virus would  expect to have a negative (not detected) result in this assay.   Blood Culture (routine x 2)     Status: None (Preliminary result)   Collection Time: 02/07/19  6:22 AM   Specimen: BLOOD RIGHT ARM  Result Value Ref Range Status   Specimen Description BLOOD RIGHT ARM  Final   Special Requests   Final    BOTTLES DRAWN AEROBIC AND ANAEROBIC Blood Culture adequate volume   Culture   Final    NO GROWTH 2 DAYS Performed at [facility], [address], [place], [place] [address]    Report Status PENDING  Incomplete  Blood Culture (routine x 2)     Status: None (Preliminary result)   Collection Time: 02/07/19  6:32 AM   Specimen: Left Antecubital;  Blood  Result Value Ref Range Status   Specimen Description LEFT ANTECUBITAL  Final   Special Requests   Final    BOTTLES DRAWN AEROBIC AND ANAEROBIC Blood Culture adequate volume   Culture   Final    NO GROWTH 2 DAYS Performed at [facility], [address] [place], [place] [address]    Report Status PENDING  Incomplete  SARS Coronavirus 2 by RT PCR (hospital order, performed in [place] hospital lab) Nasopharyngeal Nasopharyngeal Swab     Status: None   Collection Time: 02/07/19  6:50 AM   Specimen: Nasopharyngeal Swab  Result Value Ref Range Status   SARS Coronavirus 2 NEGATIVE NEGATIVE Final    Comment: (NOTE) SARS-CoV-2 target nucleic acids are NOT DETECTED. The SARS-CoV-2 RNA is generally detectable in upper and lower respiratory specimens during the acute phase of infection. The lowest concentration of SARS-CoV-2 viral copies this assay can detect is 250 copies / mL. A negative result does not preclude SARS-CoV-2 infection and should not be used as the sole basis for treatment or other patient management decisions.  A negative result may occur with improper specimen collection / handling, submission of specimen other than nasopharyngeal swab, presence of viral mutation(s) within the areas targeted by this assay, and inadequate number of viral copies (<250 copies / mL). A negative result must be combined with clinical observations, patient history, and epidemiological information. Fact Sheet for Patients:   StrictlyIdeas.no Fact Sheet for Healthcare Providers: BankingDealers.co.za This test is not yet approved or cleared  by the [place] FDA and has been authorized for detection and/or diagnosis of SARS-CoV-2 by FDA under an Emergency Use Authorization (EUA).  This EUA will remain in effect (meaning this test can be used) for the duration of the COVID-19 declaration under Section 564(b)(1) of the Act, 21 U.S.C. section  360bbb-3(b)(1), unless the authorization is terminated or revoked sooner. Performed at [facility], [address], [place], [place] [address]      Labs: BNP (last 3 results) Recent Labs    02/07/19 0623  BNP [id number]   Basic Metabolic Panel: Recent Labs  Lab 02/07/19 0623 02/08/19 0551  NA 137 137  K 3.5 4.3  CL 100 100  CO2 27 26  GLUCOSE 115* 144*  BUN 11 14  CREATININE 0.59 0.60  CALCIUM 8.6* 9.2  MG  --  2.3   Liver Function Tests: Recent Labs  Lab 02/07/19 0623  AST 50*  ALT 40  ALKPHOS 62  BILITOT 1.3*  PROT 6.3*  ALBUMIN 2.9*   No results for input(s): LIPASE, AMYLASE in the last 168 hours. No results for input(s): AMMONIA in the last 168 hours. CBC: Recent Labs  Lab 02/07/19 0623 02/08/19 0551  WBC 11.6* 10.2  NEUTROABS 7.8*  --   HGB 12.6 12.1  HCT 39.4 38.4  MCV 96.8 97.0  PLT 212 246   Cardiac Enzymes: No results for input(s): CKTOTAL, CKMB, CKMBINDEX, TROPONINI in the last 168 hours. BNP: Invalid input(s): POCBNP CBG: No results for input(s): GLUCAP in the last 168 hours. D-Dimer Recent Labs    02/07/19 0623  DDIMER 2.73*   Hgb A1c No results for input(s): HGBA1C in the last 72 hours. Lipid Profile Recent Labs    02/07/19 0623  TRIG 63   Thyroid function studies Recent Labs    02/07/19 1234  TSH 1.909   Anemia work up Recent Labs    02/07/19 0623  FERRITIN 114   Urinalysis    Component Value Date/Time   COLORURINE YELLOW 02/08/2019 1500   APPEARANCEUR HAZY (A) 02/08/2019 1500   LABSPEC 1.014 02/08/2019 1500   PHURINE 6.0 02/08/2019 1500   GLUCOSEU NEGATIVE 02/08/2019 1500   HGBUR NEGATIVE 02/08/2019 1500   BILIRUBINUR NEGATIVE 02/08/2019 1500   KETONESUR NEGATIVE 02/08/2019 1500   PROTEINUR NEGATIVE 02/08/2019 1500   NITRITE NEGATIVE 02/08/2019 1500   LEUKOCYTESUR MODERATE (A) 02/08/2019 1500   Sepsis Labs Invalid input(s): PROCALCITONIN,  WBC,  LACTICIDVEN Microbiology Recent Results (from the past 240  hour(s))  Novel Coronavirus, NAA ([facility])     Status: None   Collection Time: 02/06/19  2:33 PM   Specimen: Nasopharyngeal(NP) swabs in vial transport medium   NASOPHARYNGE  TESTING  Result Value Ref Range Status   SARS-CoV-2, NAA Not Detected Not Detected Final    Comment: This nucleic acid amplification test was developed and its performance characteristics determined by [employer]. Nucleic acid amplification tests include PCR and TMA. This test has not been FDA cleared or approved. This test has been authorized by FDA under an Emergency Use Authorization (EUA). This test is only authorized for the duration of time the declaration that circumstances exist justifying the authorization of the emergency use of in vitro diagnostic tests for detection of SARS-CoV-2 virus and/or diagnosis of COVID-19 infection under section 564(b)(1) of the Act, 21 U.S.C. [id number]) (1), unless the authorization is terminated or revoked sooner. When diagnostic testing is negative, the possibility of a false negative result should be considered in the context of a patient's recent exposures and the presence of clinical signs and symptoms consistent with COVID-19. An individual without symptoms of COVID-19 and who is not shedding SARS-CoV-2 virus would  expect to have a negative (not detected) result in this assay.   Blood Culture (routine x 2)     Status: None (Preliminary result)   Collection Time: 02/07/19  6:22 AM   Specimen: BLOOD RIGHT ARM  Result Value Ref Range Status   Specimen Description BLOOD RIGHT ARM  Final   Special Requests   Final    BOTTLES DRAWN AEROBIC AND ANAEROBIC Blood Culture adequate volume   Culture   Final    NO GROWTH 2 DAYS Performed at [facility], [address], [place], [place] [address]    Report Status PENDING  Incomplete  Blood Culture (routine x  2)     Status: None (Preliminary result)   Collection Time: 02/07/19  6:32 AM   Specimen: Left Antecubital;  Blood  Result Value Ref Range Status   Specimen Description LEFT ANTECUBITAL  Final   Special Requests   Final    BOTTLES DRAWN AEROBIC AND ANAEROBIC Blood Culture adequate volume   Culture   Final    NO GROWTH 2 DAYS Performed at [facility], [address], [place], [place] [address]    Report Status PENDING  Incomplete  SARS Coronavirus 2 by RT PCR (hospital order, performed in [place] hospital lab) Nasopharyngeal Nasopharyngeal Swab     Status: None   Collection Time: 02/07/19  6:50 AM   Specimen: Nasopharyngeal Swab  Result Value Ref Range Status   SARS Coronavirus 2 NEGATIVE NEGATIVE Final    Comment: (NOTE) SARS-CoV-2 target nucleic acids are NOT DETECTED. The SARS-CoV-2 RNA is generally detectable in upper and lower respiratory specimens during the acute phase of infection. The lowest concentration of SARS-CoV-2 viral copies this assay can detect is 250 copies / mL. A negative result does not preclude SARS-CoV-2 infection and should not be used as the sole basis for treatment or other patient management decisions.  A negative result may occur with improper specimen collection / handling, submission of specimen other than nasopharyngeal swab, presence of viral mutation(s) within the areas targeted by this assay, and inadequate number of viral copies (<250 copies / mL). A negative result must be combined with clinical observations, patient history, and epidemiological information. Fact Sheet for Patients:   StrictlyIdeas.no Fact Sheet for Healthcare Providers: BankingDealers.co.za This test is not yet approved or cleared  by the [place] FDA and has been authorized for detection and/or diagnosis of SARS-CoV-2 by FDA under an Emergency Use Authorization (EUA).  This EUA will remain in effect (meaning this test can be used) for the duration of the COVID-19 declaration under Section 564(b)(1) of the Act, 21 U.S.C. section  360bbb-3(b)(1), unless the authorization is terminated or revoked sooner. Performed at [facility], [address], [place], [place] [address]      Time coordinating discharge: 35 minutes  SIGNED:   Rodena Goldmann, DO [facility] 02/09/2019, 9:54 AM  If 7PM-7AM, please contact night-coverage [email or web address]

## 2019-02-11 LAB — URINE CULTURE: Culture: 30000 — AB

## 2019-02-11 LAB — LEGIONELLA PNEUMOPHILA SEROGP 1 UR AG: L. pneumophila Serogp 1 Ur Ag: NEGATIVE

## 2019-02-12 LAB — CULTURE, BLOOD (ROUTINE X 2)
Culture: NO GROWTH
Culture: NO GROWTH
Special Requests: ADEQUATE
Special Requests: ADEQUATE

## 2019-02-12 NOTE — Care Management Important Message (Signed)
Important Message  Patient Details  Name: Linda Williamson MRN: [id number] Date of Birth: 17-Nov-1934   Medicare Important Message Given:  Yes     Tommy Medal 02/12/2019, 10:58 AM

## 2019-02-15 DIAGNOSIS — Z6824 Body mass index (BMI) 24.0-24.9, adult: Secondary | ICD-10-CM | POA: Diagnosis not present

## 2019-02-15 DIAGNOSIS — J189 Pneumonia, unspecified organism: Secondary | ICD-10-CM | POA: Diagnosis not present

## 2019-02-15 DIAGNOSIS — I10 Essential (primary) hypertension: Secondary | ICD-10-CM | POA: Diagnosis not present

## 2019-02-15 DIAGNOSIS — J441 Chronic obstructive pulmonary disease with (acute) exacerbation: Secondary | ICD-10-CM | POA: Diagnosis not present

## 2019-03-09 DIAGNOSIS — J449 Chronic obstructive pulmonary disease, unspecified: Secondary | ICD-10-CM | POA: Diagnosis not present

## 2019-03-13 ENCOUNTER — Telehealth: Payer: Self-pay | Admitting: Adult Health

## 2019-03-13 NOTE — Telephone Encounter (Signed)
Patient called, stated that she is having terrible shoulder pain and her hemorrhoids are bleeding, sticking out and constipated.  She had a urine sample that she took this morning and wants to bring it and drop off.  I know we don't treat those things here, but thought I would ask to see what you advise.  [phone]

## 2019-03-13 NOTE — Telephone Encounter (Signed)
Called the patient, she will bring a sample tomorrow.  She stated that she has not felt good all day.

## 2019-03-15 ENCOUNTER — Other Ambulatory Visit: Payer: Self-pay

## 2019-03-15 ENCOUNTER — Encounter: Payer: Self-pay | Admitting: Adult Health

## 2019-03-15 ENCOUNTER — Ambulatory Visit: Payer: PPO | Admitting: Adult Health

## 2019-03-15 ENCOUNTER — Ambulatory Visit (INDEPENDENT_AMBULATORY_CARE_PROVIDER_SITE_OTHER): Payer: PPO | Admitting: Adult Health

## 2019-03-15 VITALS — BP 147/75 | HR 80 | Ht 68.0 in | Wt 168.2 lb

## 2019-03-15 DIAGNOSIS — N898 Other specified noninflammatory disorders of vagina: Secondary | ICD-10-CM | POA: Diagnosis not present

## 2019-03-15 NOTE — Progress Notes (Signed)
  Subjective:     Patient ID: Linda Williamson, female   DOB: [birth year], 85 y.o.   MRN: [id number]  HPI Linda Williamson is a 85 year old white female, recently widowed, sp hysterectomy in complaining of vaginal spotting and some pain at times, had bladder tact years ago and had stitch clipped 12/14/2018 by Dr Rip Harbour. She had COVID last year.  PCP is Delman Cheadle PA  Review of Systems +vaginal spotting at times and feels some pain Has pain in back but has been sewing more lately  Reviewed past medical,surgical, social and family history. Reviewed medications and allergies.     Objective:   Physical Exam BP (!) 147/75 (BP Location: Left Arm, Patient Position: Sitting, Cuff Size: Normal)   Pulse 80   Ht 5\' 8"  (1.727 m)   Wt 168 lb 3.2 oz (76.3 kg)   BMI 25.57 kg/m   Skin warm and dry.Pelvic: external genitalia is normal in appearance no lesions, vagina: atrophic, can feel mesh, but no bleeding noted,urethra has no lesions or masses noted, cervix and uterus are absent, adnexa: no masses or tenderness noted. Bladder is non tender and no masses felt. Examination chaperoned by Levy Pupa LPN    Assessment:     1. Vaginal irritation   will just watch for now    Plan:    Follow up prn

## 2019-03-16 ENCOUNTER — Other Ambulatory Visit (HOSPITAL_COMMUNITY): Payer: Self-pay | Admitting: Family Medicine

## 2019-03-16 ENCOUNTER — Other Ambulatory Visit: Payer: Self-pay | Admitting: Family Medicine

## 2019-03-16 DIAGNOSIS — M25511 Pain in right shoulder: Secondary | ICD-10-CM

## 2019-03-16 DIAGNOSIS — R9389 Abnormal findings on diagnostic imaging of other specified body structures: Secondary | ICD-10-CM

## 2019-03-16 DIAGNOSIS — M546 Pain in thoracic spine: Secondary | ICD-10-CM

## 2019-03-22 ENCOUNTER — Telehealth: Payer: Self-pay | Admitting: *Deleted

## 2019-03-22 NOTE — Telephone Encounter (Signed)
Pt requesting that Anderson Malta call her asap. Still bleeding "down there" wants to speak to [facility].

## 2019-03-23 NOTE — Telephone Encounter (Signed)
Pt has blood on pad in mornings thinks it is where had stitch removed from  Bladder sling, to come in Tuesday at 3:10 pm to see Dr Glo Herring.

## 2019-03-24 ENCOUNTER — Encounter (HOSPITAL_COMMUNITY): Payer: Self-pay | Admitting: Emergency Medicine

## 2019-03-24 ENCOUNTER — Emergency Department (HOSPITAL_COMMUNITY)
Admission: EM | Admit: 2019-03-24 | Discharge: 2019-03-24 | Disposition: A | Discharge status: Home / Self Care | Payer: PPO | Attending: Emergency Medicine | Admitting: Emergency Medicine

## 2019-03-24 ENCOUNTER — Other Ambulatory Visit: Payer: Self-pay

## 2019-03-24 ENCOUNTER — Emergency Department (HOSPITAL_COMMUNITY): Payer: PPO

## 2019-03-24 DIAGNOSIS — R0789 Other chest pain: Secondary | ICD-10-CM | POA: Diagnosis not present

## 2019-03-24 DIAGNOSIS — Z5321 Procedure and treatment not carried out due to patient leaving prior to being seen by health care provider: Secondary | ICD-10-CM | POA: Diagnosis not present

## 2019-03-24 DIAGNOSIS — R079 Chest pain, unspecified: Secondary | ICD-10-CM | POA: Diagnosis not present

## 2019-03-24 LAB — TROPONIN I (HIGH SENSITIVITY): Troponin I (High Sensitivity): <2 ng/L (ref <18)

## 2019-03-24 LAB — CBC
HCT: 40.6 % (ref 36.0–46.0)
Hemoglobin: 12.8 g/dL (ref 12.0–15.0)
MCH: 30.3 pg (ref 26.0–34.0)
MCHC: 31.5 g/dL (ref 30.0–36.0)
MCV: 96.2 fL (ref 80.0–100.0)
Platelets: 189 10*3/uL (ref 150–400)
RBC: 4.22 MIL/uL (ref 3.87–5.11)
RDW: 13.3 % (ref 11.5–15.5)
WBC: 6.9 10*3/uL (ref 4.0–10.5)
nRBC: 0 % (ref 0.0–0.2)

## 2019-03-24 LAB — BASIC METABOLIC PANEL
Anion gap: 6 (ref 5–15)
BUN: 14 mg/dL (ref 8–23)
CO2: 28 mmol/L (ref 22–32)
Calcium: 9.4 mg/dL (ref 8.9–10.3)
Chloride: 105 mmol/L (ref 98–111)
Creatinine, Ser: 0.61 mg/dL (ref 0.44–1.00)
GFR calc Af Amer: >60 mL/min (ref >60)
GFR calc non Af Amer: >60 mL/min (ref >60)
Glucose, Bld: 103 mg/dL — ABNORMAL HIGH (ref 70–99)
Potassium: 3.8 mmol/L (ref 3.5–5.1)
Sodium: 139 mmol/L (ref 135–145)

## 2019-03-24 MED ORDER — SODIUM CHLORIDE 0.9% FLUSH: 3.0000 mL | Freq: Once | INTRAVENOUS | Status: DC

## 2019-03-24 NOTE — ED Triage Notes (Signed)
Patient complains of chest pain that began this afternoon in the center that radiates to her back.

## 2019-03-27 ENCOUNTER — Ambulatory Visit (INDEPENDENT_AMBULATORY_CARE_PROVIDER_SITE_OTHER): Payer: PPO | Admitting: Obstetrics and Gynecology

## 2019-03-27 ENCOUNTER — Encounter: Payer: Self-pay | Admitting: Obstetrics and Gynecology

## 2019-03-27 ENCOUNTER — Other Ambulatory Visit: Payer: Self-pay

## 2019-03-27 VITALS — BP 160/85 | HR 96 | Ht 68.0 in | Wt 169.4 lb

## 2019-03-27 DIAGNOSIS — T83718D Erosion of other implanted mesh and other prosthetic materials to surrounding organ or tissue, subsequent encounter: Secondary | ICD-10-CM | POA: Diagnosis not present

## 2019-03-27 NOTE — Progress Notes (Signed)
[facility] Visit  @DATE @            Patient name: Linda Williamson MRN [id number]  Date of birth: 1935-03-02  CC & HPI:  [name] is a 85 y.o. female presenting today for follow-up after some mesh trimming by Dr. Rip Harbour during a visit 12/08/2018 with [name].  The patient has resumed having some light spotting.  She is having no urinary symptoms.  She is status post hysterectomy  ROS:  ROS   Pertinent History Reviewed:   Reviewed: Significant for mid urethral sling placement years ago.  She does not remember the surgeon Medical         Past Medical History:  Diagnosis Date  . Bilateral lower extremity edema   . Bilateral lower extremity edema 04/2013  . Cancer ([place])   . Cataract   . Hypertension   . Thyroid disease                               Surgical Hx:    Past Surgical History:  Procedure Laterality Date  . ABDOMINAL HYSTERECTOMY    . APPENDECTOMY    . CATARACT EXTRACTION    . CHOLECYSTECTOMY    . THROAT SURGERY     Medications: Reviewed & Updated - see associated section                       Current Outpatient Medications:  .  albuterol (VENTOLIN HFA) 108 (90 Base) MCG/ACT inhaler, Inhale 2 puffs into the lungs every 6 (six) hours as needed for wheezing or shortness of breath. (Patient not taking: Reported on 03/15/2019), Disp: 8 g, Rfl: 2 .  ALPRAZolam (XANAX) 0.5 MG tablet, Take 0.5 mg by mouth at bedtime., Disp: , Rfl:  .  amLODipine (NORVASC) 5 MG tablet, Take 5 mg by mouth daily. , Disp: , Rfl:  .  esomeprazole (NEXIUM) 40 MG capsule, Take 40 mg by mouth daily. , Disp: , Rfl:  .  fluticasone (FLONASE) 50 MCG/ACT nasal spray, Place 2 sprays into both nostrils daily., Disp: , Rfl:  .  levothyroxine (SYNTHROID) 75 MCG tablet, Take 1 tablet (75 mcg total) by mouth daily., Disp: 30 tablet, Rfl: 2 .  naproxen sodium (ALEVE) 220 MG tablet, Take 220 mg by mouth 2 (two) times daily as needed (pain)., Disp: , Rfl:  .  nicotine (NICODERM CQ - DOSED IN  MG/24 HR) 7 mg/24hr patch, Place 1 patch (7 mg total) onto the skin daily. (Patient not taking: Reported on 03/15/2019), Disp: 28 patch, Rfl: 0 .  nystatin cream (MYCOSTATIN), Apply 1 application topically 2 (two) times daily. , Disp: , Rfl:   Current Facility-Administered Medications:  .  triamcinolone acetonide (KENALOG) 10 MG/ML injection 10 mg, 10 mg, Other, Once, Regal, Tamala Fothergill, DPM   Social History: Reviewed -  reports that she quit smoking about 7 weeks ago. Her smoking use included cigarettes. She smoked 0.50 packs per day. She has never used smokeless tobacco.  Objective Findings:  Vitals: Blood pressure (!) 160/85, pulse 96, height 5\' 8"  (1.727 m), weight 169 lb 6.4 oz (76.8 kg).  PHYSICAL EXAMINATION General appearance - alert, well appearing, and in no distress and oriented to person, place, and time Mental status - alert, oriented to person, place, and time Chest - clear to auscultation, no wheezes, rales or rhonchi, symmetric air entry Heart - normal rate and  regular rhythm Abdomen - soft, nontender, nondistended, no masses or organomegaly Breasts -  Skin - normal coloration and turgor, no rashes, no suspicious skin lesions noted  PELVIC External genitalia -normal for age atrophic Vulva -normal for age Vagina -small dimple on the patient's right side at 11:00 in the mid urethral area where a tip of a discolored piece of mesh can be seen.  Efforts to pull out the mesh to where we could trim it were unsuccessful moderate amount of bleeding from effort to remove the mesh Cervix -surgically absent} Uterus -  surgically absent} Adnexa -nontender [place] -not done Rectal - rectal exam not indicated    Assessment & Plan:   A:  1.  exposed mesh from midurethral sling, right side anterior vagina.  P:  1.  refer to urology.

## 2019-04-03 ENCOUNTER — Other Ambulatory Visit: Payer: Self-pay

## 2019-04-03 ENCOUNTER — Ambulatory Visit (HOSPITAL_COMMUNITY)
Admission: RE | Admit: 2019-04-03 | Discharge: 2019-04-03 | Disposition: A | Discharge status: Home / Self Care | Payer: PPO | Source: Ambulatory Visit | Attending: Family Medicine | Admitting: Family Medicine

## 2019-04-03 DIAGNOSIS — R9389 Abnormal findings on diagnostic imaging of other specified body structures: Secondary | ICD-10-CM | POA: Insufficient documentation

## 2019-04-03 DIAGNOSIS — R918 Other nonspecific abnormal finding of lung field: Secondary | ICD-10-CM | POA: Diagnosis not present

## 2019-04-03 MED ORDER — IOHEXOL 300 MG/ML  SOLN
75.0000 mL | Freq: Once | INTRAMUSCULAR | Status: AC | PRN
  Administered 2019-04-03: 75 mL via INTRAVENOUS

## 2019-04-08 DIAGNOSIS — M1991 Primary osteoarthritis, unspecified site: Secondary | ICD-10-CM | POA: Diagnosis not present

## 2019-04-08 DIAGNOSIS — E063 Autoimmune thyroiditis: Secondary | ICD-10-CM | POA: Diagnosis not present

## 2019-04-08 DIAGNOSIS — E7849 Other hyperlipidemia: Secondary | ICD-10-CM | POA: Diagnosis not present

## 2019-04-08 DIAGNOSIS — I10 Essential (primary) hypertension: Secondary | ICD-10-CM | POA: Diagnosis not present

## 2019-04-13 DIAGNOSIS — M546 Pain in thoracic spine: Secondary | ICD-10-CM | POA: Diagnosis not present

## 2019-04-13 DIAGNOSIS — N390 Urinary tract infection, site not specified: Secondary | ICD-10-CM | POA: Diagnosis not present

## 2019-04-13 DIAGNOSIS — Z6824 Body mass index (BMI) 24.0-24.9, adult: Secondary | ICD-10-CM | POA: Diagnosis not present

## 2019-04-13 DIAGNOSIS — G8929 Other chronic pain: Secondary | ICD-10-CM | POA: Diagnosis not present

## 2019-04-17 ENCOUNTER — Other Ambulatory Visit: Payer: Self-pay | Admitting: *Deleted

## 2019-04-17 DIAGNOSIS — T83718D Erosion of other implanted mesh and other prosthetic materials to surrounding organ or tissue, subsequent encounter: Secondary | ICD-10-CM

## 2019-05-06 DIAGNOSIS — E7849 Other hyperlipidemia: Secondary | ICD-10-CM | POA: Diagnosis not present

## 2019-05-06 DIAGNOSIS — M1991 Primary osteoarthritis, unspecified site: Secondary | ICD-10-CM | POA: Diagnosis not present

## 2019-05-06 DIAGNOSIS — I10 Essential (primary) hypertension: Secondary | ICD-10-CM | POA: Diagnosis not present

## 2019-05-06 DIAGNOSIS — E063 Autoimmune thyroiditis: Secondary | ICD-10-CM | POA: Diagnosis not present

## 2019-06-06 DIAGNOSIS — E063 Autoimmune thyroiditis: Secondary | ICD-10-CM | POA: Diagnosis not present

## 2019-06-06 DIAGNOSIS — M1991 Primary osteoarthritis, unspecified site: Secondary | ICD-10-CM | POA: Diagnosis not present

## 2019-06-06 DIAGNOSIS — E7849 Other hyperlipidemia: Secondary | ICD-10-CM | POA: Diagnosis not present

## 2019-06-06 DIAGNOSIS — I10 Essential (primary) hypertension: Secondary | ICD-10-CM | POA: Diagnosis not present

## 2019-07-05 DIAGNOSIS — M545 Low back pain: Secondary | ICD-10-CM | POA: Diagnosis not present

## 2019-07-05 DIAGNOSIS — R309 Painful micturition, unspecified: Secondary | ICD-10-CM | POA: Diagnosis not present

## 2019-07-05 DIAGNOSIS — R102 Pelvic and perineal pain: Secondary | ICD-10-CM | POA: Diagnosis not present

## 2019-07-06 ENCOUNTER — Other Ambulatory Visit (HOSPITAL_COMMUNITY): Payer: Self-pay | Admitting: Family Medicine

## 2019-07-06 ENCOUNTER — Other Ambulatory Visit: Payer: Self-pay

## 2019-07-06 ENCOUNTER — Ambulatory Visit (HOSPITAL_COMMUNITY)
Admission: RE | Admit: 2019-07-06 | Discharge: 2019-07-06 | Disposition: A | Discharge status: Home / Self Care | Payer: PPO | Source: Ambulatory Visit | Attending: Family Medicine | Admitting: Family Medicine

## 2019-07-06 DIAGNOSIS — E7849 Other hyperlipidemia: Secondary | ICD-10-CM | POA: Diagnosis not present

## 2019-07-06 DIAGNOSIS — M545 Low back pain, unspecified: Secondary | ICD-10-CM

## 2019-07-06 DIAGNOSIS — M1991 Primary osteoarthritis, unspecified site: Secondary | ICD-10-CM | POA: Diagnosis not present

## 2019-07-06 DIAGNOSIS — H9313 Tinnitus, bilateral: Secondary | ICD-10-CM | POA: Diagnosis not present

## 2019-07-06 DIAGNOSIS — F419 Anxiety disorder, unspecified: Secondary | ICD-10-CM | POA: Diagnosis not present

## 2019-07-06 DIAGNOSIS — M47814 Spondylosis without myelopathy or radiculopathy, thoracic region: Secondary | ICD-10-CM | POA: Diagnosis not present

## 2019-07-06 DIAGNOSIS — M1990 Unspecified osteoarthritis, unspecified site: Secondary | ICD-10-CM | POA: Diagnosis not present

## 2019-07-06 DIAGNOSIS — F17290 Nicotine dependence, other tobacco product, uncomplicated: Secondary | ICD-10-CM | POA: Diagnosis not present

## 2019-07-06 DIAGNOSIS — E663 Overweight: Secondary | ICD-10-CM | POA: Diagnosis not present

## 2019-07-06 DIAGNOSIS — I10 Essential (primary) hypertension: Secondary | ICD-10-CM | POA: Diagnosis not present

## 2019-07-06 DIAGNOSIS — E063 Autoimmune thyroiditis: Secondary | ICD-10-CM | POA: Diagnosis not present

## 2019-07-06 DIAGNOSIS — M5136 Other intervertebral disc degeneration, lumbar region: Secondary | ICD-10-CM | POA: Diagnosis not present

## 2019-07-06 DIAGNOSIS — Z1389 Encounter for screening for other disorder: Secondary | ICD-10-CM | POA: Diagnosis not present

## 2019-07-06 DIAGNOSIS — J449 Chronic obstructive pulmonary disease, unspecified: Secondary | ICD-10-CM | POA: Diagnosis not present

## 2019-07-06 DIAGNOSIS — Z6825 Body mass index (BMI) 25.0-25.9, adult: Secondary | ICD-10-CM | POA: Diagnosis not present

## 2019-08-06 DIAGNOSIS — I10 Essential (primary) hypertension: Secondary | ICD-10-CM | POA: Diagnosis not present

## 2019-08-06 DIAGNOSIS — E063 Autoimmune thyroiditis: Secondary | ICD-10-CM | POA: Diagnosis not present

## 2019-08-06 DIAGNOSIS — E7849 Other hyperlipidemia: Secondary | ICD-10-CM | POA: Diagnosis not present

## 2019-08-06 DIAGNOSIS — M1991 Primary osteoarthritis, unspecified site: Secondary | ICD-10-CM | POA: Diagnosis not present

## 2019-08-10 DIAGNOSIS — F419 Anxiety disorder, unspecified: Secondary | ICD-10-CM | POA: Diagnosis not present

## 2019-08-10 DIAGNOSIS — E063 Autoimmune thyroiditis: Secondary | ICD-10-CM | POA: Diagnosis not present

## 2019-08-10 DIAGNOSIS — Z6824 Body mass index (BMI) 24.0-24.9, adult: Secondary | ICD-10-CM | POA: Diagnosis not present

## 2019-08-10 DIAGNOSIS — M1990 Unspecified osteoarthritis, unspecified site: Secondary | ICD-10-CM | POA: Diagnosis not present

## 2019-08-10 DIAGNOSIS — I10 Essential (primary) hypertension: Secondary | ICD-10-CM | POA: Diagnosis not present

## 2019-09-06 DIAGNOSIS — F419 Anxiety disorder, unspecified: Secondary | ICD-10-CM | POA: Diagnosis not present

## 2019-09-06 DIAGNOSIS — Z0001 Encounter for general adult medical examination with abnormal findings: Secondary | ICD-10-CM | POA: Diagnosis not present

## 2019-09-06 DIAGNOSIS — R7309 Other abnormal glucose: Secondary | ICD-10-CM | POA: Diagnosis not present

## 2019-09-06 DIAGNOSIS — Z1389 Encounter for screening for other disorder: Secondary | ICD-10-CM | POA: Diagnosis not present

## 2019-09-06 DIAGNOSIS — E063 Autoimmune thyroiditis: Secondary | ICD-10-CM | POA: Diagnosis not present

## 2019-09-06 DIAGNOSIS — J449 Chronic obstructive pulmonary disease, unspecified: Secondary | ICD-10-CM | POA: Diagnosis not present

## 2019-09-06 DIAGNOSIS — F330 Major depressive disorder, recurrent, mild: Secondary | ICD-10-CM | POA: Diagnosis not present

## 2019-09-06 DIAGNOSIS — F17290 Nicotine dependence, other tobacco product, uncomplicated: Secondary | ICD-10-CM | POA: Diagnosis not present

## 2019-09-06 DIAGNOSIS — I10 Essential (primary) hypertension: Secondary | ICD-10-CM | POA: Diagnosis not present

## 2019-09-06 DIAGNOSIS — Z6824 Body mass index (BMI) 24.0-24.9, adult: Secondary | ICD-10-CM | POA: Diagnosis not present

## 2019-10-05 DIAGNOSIS — M1991 Primary osteoarthritis, unspecified site: Secondary | ICD-10-CM | POA: Diagnosis not present

## 2019-10-05 DIAGNOSIS — E7849 Other hyperlipidemia: Secondary | ICD-10-CM | POA: Diagnosis not present

## 2019-10-05 DIAGNOSIS — E063 Autoimmune thyroiditis: Secondary | ICD-10-CM | POA: Diagnosis not present

## 2019-10-05 DIAGNOSIS — I10 Essential (primary) hypertension: Secondary | ICD-10-CM | POA: Diagnosis not present

## 2019-11-02 DIAGNOSIS — M1990 Unspecified osteoarthritis, unspecified site: Secondary | ICD-10-CM | POA: Diagnosis not present

## 2019-11-02 DIAGNOSIS — Z6824 Body mass index (BMI) 24.0-24.9, adult: Secondary | ICD-10-CM | POA: Diagnosis not present

## 2019-11-09 ENCOUNTER — Other Ambulatory Visit: Payer: Self-pay | Admitting: Family Medicine

## 2019-11-09 DIAGNOSIS — M5136 Other intervertebral disc degeneration, lumbar region: Secondary | ICD-10-CM

## 2019-11-09 DIAGNOSIS — M51369 Other intervertebral disc degeneration, lumbar region without mention of lumbar back pain or lower extremity pain: Secondary | ICD-10-CM

## 2019-11-29 ENCOUNTER — Other Ambulatory Visit: Payer: Self-pay

## 2019-11-29 ENCOUNTER — Ambulatory Visit (HOSPITAL_COMMUNITY)
Admission: RE | Admit: 2019-11-29 | Discharge: 2019-11-29 | Disposition: A | Discharge status: Home / Self Care | Payer: PPO | Source: Ambulatory Visit | Attending: Family Medicine | Admitting: Family Medicine

## 2019-11-29 DIAGNOSIS — M5136 Other intervertebral disc degeneration, lumbar region: Secondary | ICD-10-CM | POA: Insufficient documentation

## 2019-11-29 DIAGNOSIS — M545 Low back pain: Secondary | ICD-10-CM | POA: Diagnosis not present

## 2019-12-06 DIAGNOSIS — I10 Essential (primary) hypertension: Secondary | ICD-10-CM | POA: Diagnosis not present

## 2019-12-06 DIAGNOSIS — E7849 Other hyperlipidemia: Secondary | ICD-10-CM | POA: Diagnosis not present

## 2019-12-06 DIAGNOSIS — M1991 Primary osteoarthritis, unspecified site: Secondary | ICD-10-CM | POA: Diagnosis not present

## 2019-12-06 DIAGNOSIS — E063 Autoimmune thyroiditis: Secondary | ICD-10-CM | POA: Diagnosis not present

## 2020-01-02 DIAGNOSIS — Z23 Encounter for immunization: Secondary | ICD-10-CM | POA: Diagnosis not present

## 2020-01-05 DIAGNOSIS — E7849 Other hyperlipidemia: Secondary | ICD-10-CM | POA: Diagnosis not present

## 2020-01-05 DIAGNOSIS — M1991 Primary osteoarthritis, unspecified site: Secondary | ICD-10-CM | POA: Diagnosis not present

## 2020-01-05 DIAGNOSIS — I10 Essential (primary) hypertension: Secondary | ICD-10-CM | POA: Diagnosis not present

## 2020-01-05 DIAGNOSIS — E063 Autoimmune thyroiditis: Secondary | ICD-10-CM | POA: Diagnosis not present

## 2020-02-06 DIAGNOSIS — E663 Overweight: Secondary | ICD-10-CM | POA: Diagnosis not present

## 2020-02-06 DIAGNOSIS — Z6825 Body mass index (BMI) 25.0-25.9, adult: Secondary | ICD-10-CM | POA: Diagnosis not present

## 2020-02-06 DIAGNOSIS — H6123 Impacted cerumen, bilateral: Secondary | ICD-10-CM | POA: Diagnosis not present

## 2020-02-06 DIAGNOSIS — J309 Allergic rhinitis, unspecified: Secondary | ICD-10-CM | POA: Diagnosis not present

## 2020-05-05 DIAGNOSIS — M79675 Pain in left toe(s): Secondary | ICD-10-CM | POA: Diagnosis not present

## 2020-05-05 DIAGNOSIS — I739 Peripheral vascular disease, unspecified: Secondary | ICD-10-CM | POA: Diagnosis not present

## 2020-05-05 DIAGNOSIS — B351 Tinea unguium: Secondary | ICD-10-CM | POA: Diagnosis not present

## 2020-05-05 DIAGNOSIS — M79674 Pain in right toe(s): Secondary | ICD-10-CM | POA: Diagnosis not present

## 2020-05-08 DIAGNOSIS — Z6825 Body mass index (BMI) 25.0-25.9, adult: Secondary | ICD-10-CM | POA: Diagnosis not present

## 2020-05-08 DIAGNOSIS — S46002S Unspecified injury of muscle(s) and tendon(s) of the rotator cuff of left shoulder, sequela: Secondary | ICD-10-CM | POA: Diagnosis not present

## 2020-05-08 DIAGNOSIS — K432 Incisional hernia without obstruction or gangrene: Secondary | ICD-10-CM | POA: Diagnosis not present

## 2020-05-08 DIAGNOSIS — Z1331 Encounter for screening for depression: Secondary | ICD-10-CM | POA: Diagnosis not present

## 2020-05-08 DIAGNOSIS — S46001S Unspecified injury of muscle(s) and tendon(s) of the rotator cuff of right shoulder, sequela: Secondary | ICD-10-CM | POA: Diagnosis not present

## 2020-06-13 DIAGNOSIS — E663 Overweight: Secondary | ICD-10-CM | POA: Diagnosis not present

## 2020-06-13 DIAGNOSIS — R143 Flatulence: Secondary | ICD-10-CM | POA: Diagnosis not present

## 2020-06-13 DIAGNOSIS — Z6826 Body mass index (BMI) 26.0-26.9, adult: Secondary | ICD-10-CM | POA: Diagnosis not present

## 2020-06-13 DIAGNOSIS — R140 Abdominal distension (gaseous): Secondary | ICD-10-CM | POA: Diagnosis not present

## 2020-06-13 DIAGNOSIS — E7849 Other hyperlipidemia: Secondary | ICD-10-CM | POA: Diagnosis not present

## 2020-06-13 DIAGNOSIS — R7309 Other abnormal glucose: Secondary | ICD-10-CM | POA: Diagnosis not present

## 2020-06-13 DIAGNOSIS — I10 Essential (primary) hypertension: Secondary | ICD-10-CM | POA: Diagnosis not present

## 2020-07-03 DIAGNOSIS — R600 Localized edema: Secondary | ICD-10-CM | POA: Diagnosis not present

## 2020-07-03 DIAGNOSIS — Z6826 Body mass index (BMI) 26.0-26.9, adult: Secondary | ICD-10-CM | POA: Diagnosis not present

## 2020-07-03 DIAGNOSIS — E663 Overweight: Secondary | ICD-10-CM | POA: Diagnosis not present

## 2020-07-10 ENCOUNTER — Other Ambulatory Visit: Payer: Self-pay

## 2020-07-10 ENCOUNTER — Ambulatory Visit (HOSPITAL_COMMUNITY)
Admission: RE | Admit: 2020-07-10 | Discharge: 2020-07-10 | Disposition: A | Discharge status: Home / Self Care | Payer: PPO | Source: Ambulatory Visit | Attending: Family Medicine | Admitting: Family Medicine

## 2020-07-10 ENCOUNTER — Other Ambulatory Visit (HOSPITAL_COMMUNITY): Payer: Self-pay | Admitting: Family Medicine

## 2020-07-10 DIAGNOSIS — R140 Abdominal distension (gaseous): Secondary | ICD-10-CM | POA: Insufficient documentation

## 2020-07-10 DIAGNOSIS — R109 Unspecified abdominal pain: Secondary | ICD-10-CM | POA: Diagnosis not present

## 2020-07-10 DIAGNOSIS — M5459 Other low back pain: Secondary | ICD-10-CM | POA: Insufficient documentation

## 2020-07-10 DIAGNOSIS — M545 Low back pain, unspecified: Secondary | ICD-10-CM | POA: Diagnosis not present

## 2020-07-11 DIAGNOSIS — Z6826 Body mass index (BMI) 26.0-26.9, adult: Secondary | ICD-10-CM | POA: Diagnosis not present

## 2020-07-11 DIAGNOSIS — R140 Abdominal distension (gaseous): Secondary | ICD-10-CM | POA: Diagnosis not present

## 2020-07-11 DIAGNOSIS — M1990 Unspecified osteoarthritis, unspecified site: Secondary | ICD-10-CM | POA: Diagnosis not present

## 2020-07-11 DIAGNOSIS — E663 Overweight: Secondary | ICD-10-CM | POA: Diagnosis not present

## 2020-07-14 DIAGNOSIS — M79675 Pain in left toe(s): Secondary | ICD-10-CM | POA: Diagnosis not present

## 2020-07-14 DIAGNOSIS — B351 Tinea unguium: Secondary | ICD-10-CM | POA: Diagnosis not present

## 2020-07-14 DIAGNOSIS — M79674 Pain in right toe(s): Secondary | ICD-10-CM | POA: Diagnosis not present

## 2020-07-14 DIAGNOSIS — I739 Peripheral vascular disease, unspecified: Secondary | ICD-10-CM | POA: Diagnosis not present

## 2020-07-23 DIAGNOSIS — H43813 Vitreous degeneration, bilateral: Secondary | ICD-10-CM | POA: Diagnosis not present

## 2020-10-02 DIAGNOSIS — H6121 Impacted cerumen, right ear: Secondary | ICD-10-CM | POA: Diagnosis not present

## 2020-10-02 DIAGNOSIS — R600 Localized edema: Secondary | ICD-10-CM | POA: Diagnosis not present

## 2020-10-02 DIAGNOSIS — Z6826 Body mass index (BMI) 26.0-26.9, adult: Secondary | ICD-10-CM | POA: Diagnosis not present

## 2020-10-02 DIAGNOSIS — E663 Overweight: Secondary | ICD-10-CM | POA: Diagnosis not present

## 2020-10-17 DIAGNOSIS — Z6826 Body mass index (BMI) 26.0-26.9, adult: Secondary | ICD-10-CM | POA: Diagnosis not present

## 2020-10-17 DIAGNOSIS — I10 Essential (primary) hypertension: Secondary | ICD-10-CM | POA: Diagnosis not present

## 2020-10-17 DIAGNOSIS — E063 Autoimmune thyroiditis: Secondary | ICD-10-CM | POA: Diagnosis not present

## 2020-10-17 DIAGNOSIS — J449 Chronic obstructive pulmonary disease, unspecified: Secondary | ICD-10-CM | POA: Diagnosis not present

## 2020-10-17 DIAGNOSIS — R600 Localized edema: Secondary | ICD-10-CM | POA: Diagnosis not present

## 2020-10-17 DIAGNOSIS — E782 Mixed hyperlipidemia: Secondary | ICD-10-CM | POA: Diagnosis not present

## 2020-10-17 DIAGNOSIS — F330 Major depressive disorder, recurrent, mild: Secondary | ICD-10-CM | POA: Diagnosis not present

## 2020-10-17 DIAGNOSIS — E663 Overweight: Secondary | ICD-10-CM | POA: Diagnosis not present

## 2020-10-17 DIAGNOSIS — F419 Anxiety disorder, unspecified: Secondary | ICD-10-CM | POA: Diagnosis not present

## 2020-10-22 ENCOUNTER — Encounter (HOSPITAL_COMMUNITY): Payer: Self-pay

## 2020-10-22 ENCOUNTER — Emergency Department (HOSPITAL_COMMUNITY): Payer: PPO

## 2020-10-22 ENCOUNTER — Emergency Department (HOSPITAL_COMMUNITY)
Admission: EM | Admit: 2020-10-22 | Discharge: 2020-10-22 | Disposition: A | Discharge status: Home / Self Care | Payer: PPO | Attending: Emergency Medicine | Admitting: Emergency Medicine

## 2020-10-22 ENCOUNTER — Other Ambulatory Visit: Payer: Self-pay

## 2020-10-22 DIAGNOSIS — J441 Chronic obstructive pulmonary disease with (acute) exacerbation: Secondary | ICD-10-CM | POA: Insufficient documentation

## 2020-10-22 DIAGNOSIS — Z79899 Other long term (current) drug therapy: Secondary | ICD-10-CM | POA: Insufficient documentation

## 2020-10-22 DIAGNOSIS — I10 Essential (primary) hypertension: Secondary | ICD-10-CM | POA: Insufficient documentation

## 2020-10-22 DIAGNOSIS — R2243 Localized swelling, mass and lump, lower limb, bilateral: Secondary | ICD-10-CM | POA: Insufficient documentation

## 2020-10-22 DIAGNOSIS — Z859 Personal history of malignant neoplasm, unspecified: Secondary | ICD-10-CM | POA: Diagnosis not present

## 2020-10-22 DIAGNOSIS — M79602 Pain in left arm: Secondary | ICD-10-CM | POA: Insufficient documentation

## 2020-10-22 DIAGNOSIS — Z87891 Personal history of nicotine dependence: Secondary | ICD-10-CM | POA: Diagnosis not present

## 2020-10-22 NOTE — ED Triage Notes (Signed)
Pt complaining of left arm pain in her vein says its been "curling up and hurting for the past 2 weeks".  Pt has no CP, reports no injury just started hurting.  Pt alert and oriented, skin warm and dry

## 2020-10-22 NOTE — ED Provider Notes (Signed)
[facility] Provider Note   CSN: [id number] Arrival date & time: 10/22/20  0844     History No chief complaint on file.   ALIYAHNA BURCK is a 86 y.o. female.  HPI  Patient presents with left arm pain.  Started on Tuesday when she was getting a piece sandwich, she states it felt like her vein was "curling up into itself".  States that the person near her states it looked like "the vein was becoming a large ball".  The pain resolved on its own, she is unable to quantify how long the pain lasted.  States the pain started again this morning, resolved independently but she came to the ED for further evaluation.  She is not having any chest pain, shortness of breath, difficulty breathing.  She does note she has bilateral ankle swelling for which she has been seen by her primary care doctor.  When asked how long this has been going on she states "a very long time".  Unable to quantify how long.  Past Medical History:  Diagnosis Date   Bilateral lower extremity edema    Bilateral lower extremity edema 04/2013   Cancer [facility])    Cataract    Hypertension    Thyroid disease     Patient Active Problem List   Diagnosis Date Noted   Vaginal irritation 03/15/2019   COPD with acute exacerbation ([place]) 02/07/2019   Dysuria 12/14/2018   Urinary frequency 12/14/2018   Hematuria 12/14/2018   Essential hypertension 04/25/2013   Bilateral lower extremity edema 04/25/2013   Weakness of right arm 01/05/2012   Numbness and tingling in right hand 01/05/2012   OTHER DYSPHAGIA 12/09/2009   GASTROESOPHAGEAL REFLUX DISEASE, HX OF 12/05/2009   SPONDYLOSIS 10/17/2008   DEGENERATIVE DISC DISEASE, LUMBAR SPINE 10/17/2008   BURSITIS, HIP 10/17/2008    Past Surgical History:  Procedure Laterality Date   ABDOMINAL HYSTERECTOMY     APPENDECTOMY     CATARACT EXTRACTION     CHOLECYSTECTOMY     THROAT SURGERY       OB History     Gravida  3   Para  3   Term      Preterm       AB      Living  3      SAB      IAB      Ectopic      Multiple      Live Births  3           Family History  Problem Relation Age of Onset   Heart failure Mother    Cancer Father        Bladder   COPD Brother    Heart disease Sister    COPD Brother     Social History   Tobacco Use   Smoking status: Former    Packs/day: 0.50    Types: Cigarettes    Quit date: 02/01/2019    Years since quitting: 1.7   Smokeless tobacco: Never  Vaping Use   Vaping Use: Never used  Substance Use Topics   Alcohol use: Yes    Comment: glass of wine occasionally    Drug use: No    Home Medications Prior to Admission medications   Medication Sig Start Date End Date Taking? Authorizing Provider  albuterol (VENTOLIN HFA) 108 (90 Base) MCG/ACT inhaler Inhale 2 puffs into the lungs every 6 (six) hours as needed for wheezing or shortness of breath. Patient  not taking: No sig reported 02/09/19   Manuella Ghazi, Pratik D, DO  ALPRAZolam Duanne Moron) 0.5 MG tablet Take 0.5 mg by mouth at bedtime.    [provider]  amLODipine (NORVASC) 5 MG tablet Take 5 mg by mouth daily.  05/04/16   [provider]  esomeprazole (NEXIUM) 40 MG capsule Take 40 mg by mouth daily.     [provider]  fluticasone (FLONASE) 50 MCG/ACT nasal spray Place 2 sprays into both nostrils daily. 11/14/18   [provider]  levothyroxine (SYNTHROID) 75 MCG tablet Take 1 tablet (75 mcg total) by mouth daily. 02/09/19 03/11/19  Manuella Ghazi, Pratik D, DO  naproxen sodium (ALEVE) 220 MG tablet Take 220 mg by mouth 2 (two) times daily as needed (pain).    [provider]  nicotine (NICODERM CQ - DOSED IN MG/24 HR) 7 mg/24hr patch Place 1 patch (7 mg total) onto the skin daily. Patient not taking: Reported on 03/15/2019 02/10/19   Heath Lark D, DO  nystatin cream (MYCOSTATIN) Apply 1 application topically 2 (two) times daily.  09/06/18   [provider]    Allergies    Keflex [cephalexin] and  Penicillins  Review of Systems   Review of Systems  Constitutional:  Negative for fatigue and fever.  Respiratory:  Negative for shortness of breath.   Cardiovascular:  Positive for leg swelling. Negative for chest pain.  Musculoskeletal:  Positive for myalgias.   Physical Exam Updated Vital Signs BP (!) 149/77 (BP Location: Right Arm)   Pulse 87   Temp (!) 97.2 F (36.2 C) (Oral)   Resp 18   Ht '5\' 9"'$  (1.753 m)   Wt 82.6 kg   SpO2 98%   BMI 26.88 kg/m   Physical Exam Vitals and nursing note reviewed. Exam conducted with a chaperone present.  Constitutional:      General: She is not in acute distress.    Appearance: Normal appearance.  HENT:     Head: Normocephalic and atraumatic.  Eyes:     General: No scleral icterus.    Extraocular Movements: Extraocular movements intact.     Pupils: Pupils are equal, round, and reactive to light.  Cardiovascular:     Rate and Rhythm: Normal rate and regular rhythm.     Pulses: Normal pulses.     Heart sounds: Normal heart sounds.     Comments: Radial pulse 2+ bilaterally. Pulmonary:     Effort: Pulmonary effort is normal.     Breath sounds: Normal breath sounds.  Musculoskeletal:        General: Swelling present.     Comments: Bilateral ankle edema over the lateral malleolus.  No pitting edema, does not extend to the calf.  DP and PT are palpable, 1+.  Left antecubital space normal in appearance, no bulging veins.  No edema or swelling, no skin coloration changes.  Skin:    General: Skin is warm.     Capillary Refill: Capillary refill takes less than 2 seconds.     Coloration: Skin is not jaundiced.  Neurological:     Mental Status: She is alert. Mental status is at baseline.     Coordination: Coordination normal.    ED Results / Procedures / Treatments   Labs (all labs ordered are listed, but only abnormal results are displayed) Labs Reviewed - No data to display  EKG None  Radiology No results  found.  Procedures Procedures   Medications Ordered in ED Medications - No data to display  ED Course  I have reviewed the triage vital signs and the nursing notes.  Pertinent labs & imaging results that were available during my care of the patient were reviewed by me and considered in my medical decision making (see chart for details).    MDM Rules/Calculators/A&P                           Patient is mildly hypertensive, but otherwise vitals are stable.  She is nontoxic-appearing.  She has full range of motion to the left arm, radial pulses 2+.  No discoloration.  She is neurovascularly intact, strong cap refill.  I will order an ultrasound to rule out a blood clot, but suspicion is very low.  She is also not having any chest pain or shortness of breath, I do not think laboratory work-up would be particularly helpful in this differential.  Vascular ultrasound reassuring, no signs of DVT.  I do not suspect any vascular disorders because of her good pulses, cap refill, lack of discoloration or swelling.  Discussed for strict return precautions the patient, I advised her to follow-up with her primary care doctor later this week.  At this time I think she is appropriate for discharge.  Final Clinical Impression(s) / ED Diagnoses Final diagnoses:  None    Rx / DC Orders ED Discharge Orders     None        Sherrill Raring, Hershal Coria 10/22/20 1839    Davonna Belling, MD 10/23/20 [phone]

## 2020-10-22 NOTE — Discharge Instructions (Addendum)
No signs of a blood clot on work-up today.  Please continue taking your medicine as prescribed.  Please follow-up with your primary care physician later this week or next week the symptoms do not improve or if they worsen.  If things change or worsen he can try back to the ED as needed, specifically if you develop chest pain or difficulty breathing please return back to the ED.

## 2020-11-26 ENCOUNTER — Encounter (HOSPITAL_COMMUNITY): Payer: PPO

## 2020-12-01 DIAGNOSIS — M79674 Pain in right toe(s): Secondary | ICD-10-CM | POA: Diagnosis not present

## 2020-12-01 DIAGNOSIS — M79675 Pain in left toe(s): Secondary | ICD-10-CM | POA: Diagnosis not present

## 2020-12-01 DIAGNOSIS — L600 Ingrowing nail: Secondary | ICD-10-CM | POA: Diagnosis not present

## 2020-12-15 DIAGNOSIS — Z23 Encounter for immunization: Secondary | ICD-10-CM | POA: Diagnosis not present

## 2020-12-25 DIAGNOSIS — Z6827 Body mass index (BMI) 27.0-27.9, adult: Secondary | ICD-10-CM | POA: Diagnosis not present

## 2020-12-25 DIAGNOSIS — E782 Mixed hyperlipidemia: Secondary | ICD-10-CM | POA: Diagnosis not present

## 2020-12-25 DIAGNOSIS — R600 Localized edema: Secondary | ICD-10-CM | POA: Diagnosis not present

## 2020-12-25 DIAGNOSIS — N39498 Other specified urinary incontinence: Secondary | ICD-10-CM | POA: Diagnosis not present

## 2020-12-25 DIAGNOSIS — J449 Chronic obstructive pulmonary disease, unspecified: Secondary | ICD-10-CM | POA: Diagnosis not present

## 2020-12-25 DIAGNOSIS — E663 Overweight: Secondary | ICD-10-CM | POA: Diagnosis not present

## 2020-12-25 DIAGNOSIS — N390 Urinary tract infection, site not specified: Secondary | ICD-10-CM | POA: Diagnosis not present

## 2020-12-25 DIAGNOSIS — F419 Anxiety disorder, unspecified: Secondary | ICD-10-CM | POA: Diagnosis not present

## 2021-01-05 ENCOUNTER — Ambulatory Visit: Payer: PPO | Admitting: Urology

## 2021-01-05 ENCOUNTER — Encounter: Payer: Self-pay | Admitting: Urology

## 2021-01-05 ENCOUNTER — Other Ambulatory Visit: Payer: Self-pay

## 2021-01-05 VITALS — BP 134/71 | HR 85

## 2021-01-05 DIAGNOSIS — R8290 Unspecified abnormal findings in urine: Secondary | ICD-10-CM | POA: Diagnosis not present

## 2021-01-05 DIAGNOSIS — N3946 Mixed incontinence: Secondary | ICD-10-CM | POA: Diagnosis not present

## 2021-01-05 DIAGNOSIS — R32 Unspecified urinary incontinence: Secondary | ICD-10-CM | POA: Diagnosis not present

## 2021-01-05 LAB — URINALYSIS, ROUTINE W REFLEX MICROSCOPIC
Bilirubin, UA: NEGATIVE
Glucose, UA: NEGATIVE
Ketones, UA: NEGATIVE
Nitrite, UA: NEGATIVE
Protein,UA: NEGATIVE
Specific Gravity, UA: 1.02 (ref 1.005–1.030)
Urobilinogen, Ur: 0.2 mg/dL (ref 0.2–1.0)
pH, UA: 5.5 (ref 5.0–7.5)

## 2021-01-05 LAB — MICROSCOPIC EXAMINATION
Renal Epithel, UA: NONE SEEN /hpf
WBC, UA: >30 /hpf — AB (ref 0–5)

## 2021-01-05 LAB — BLADDER SCAN AMB NON-IMAGING

## 2021-01-05 MED ORDER — MIRABEGRON ER 50 MG PO TB24: 50.0000 mg | ORAL_TABLET | Freq: Every day | ORAL | 0 refills | Status: DC

## 2021-01-05 NOTE — Progress Notes (Signed)
Assessment: 1. Mixed incontinence   2. Abnormal urine findings     Plan: Diagnosis and management of stress and urge incontinence discussed with the patient.  Options for management of stress incontinence discussed including pelvic floor exercises, biofeedback, incontinence pessary use, and surgical management.  Options for management of urge incontinence including avoidance of dietary irritants, behavioral therapy, medical therapy, neuromodulation, and chemodenervation discussed.  Her primary symptom at the present time is urge incontinence. Urine culture today - will call with results Trial of Myrbetriq 50 mg daily.  Samples given. Kegel exercises  Return to office in 1 month  Chief Complaint:  Chief Complaint  Patient presents with   Urinary Incontinence     History of Present Illness:  Linda Williamson is a 86 y.o. year old female who is seen in consultation from Jake Samples, [place]  for evaluation of urinary incontinence.  She reports a history of stress incontinence for a number of years.  She has recently had worsening urge incontinence.  This is noticeable after laughing.  She has urgency with associated incontinence.  She is using at least 3 poise pads per day.  She does have nighttime incontinence as well.  She has nocturia x3.  No dysuria or gross hematuria.  No recent UTIs.  No prior medical therapy for her symptoms.  No problems with constipation.   Past Medical History:  Past Medical History:  Diagnosis Date   Bilateral lower extremity edema    Bilateral lower extremity edema 04/2013   Cancer ([place])    Cataract    Hypertension    Thyroid disease     Past Surgical History:  Past Surgical History:  Procedure Laterality Date   ABDOMINAL HYSTERECTOMY     APPENDECTOMY     CATARACT EXTRACTION     CHOLECYSTECTOMY     THROAT SURGERY      Allergies:  Allergies  Allergen Reactions   Keflex [Cephalexin]    Penicillins Rash    Family History:  Family  History  Problem Relation Age of Onset   Heart failure Mother    Cancer Father        Bladder   COPD Brother    Heart disease Sister    COPD Brother     Social History:  Social History   Tobacco Use   Smoking status: Former    Packs/day: 0.50    Types: Cigarettes    Quit date: 02/01/2019    Years since quitting: 1.9   Smokeless tobacco: Never  Vaping Use   Vaping Use: Never used  Substance Use Topics   Alcohol use: Yes    Comment: glass of wine occasionally    Drug use: No    Review of symptoms:  Constitutional:  Negative for unexplained weight loss, night sweats, fever, chills ENT:  Negative for nose bleeds, sinus pain, painful swallowing CV:  Negative for chest pain, shortness of breath, exercise intolerance, palpitations, loss of consciousness Resp:  Negative for cough, wheezing, shortness of breath GI:  Negative for nausea, vomiting, diarrhea, bloody stools GU:  Positives noted in HPI; otherwise negative for gross hematuria, dysuria Neuro:  Negative for seizures, poor balance, limb weakness, slurred speech Psych:  Negative for lack of energy, depression, anxiety Endocrine:  Negative for polydipsia, polyuria, symptoms of hypoglycemia (dizziness, hunger, sweating) Hematologic:  Negative for anemia, purpura, petechia, prolonged or excessive bleeding, use of anticoagulants  Allergic:  Negative for difficulty breathing or choking as a result of exposure to anything; no  shellfish allergy; no allergic response (rash/itch) to materials, foods  Physical exam: BP 134/71   Pulse 85  GENERAL APPEARANCE:  Well appearing, well developed, well nourished, NAD HEENT: Atraumatic, Normocephalic, oropharynx clear. NECK: Supple without lymphadenopathy or thyromegaly. LUNGS: Clear to auscultation bilaterally. HEART: Regular Rate and Rhythm without murmurs, gallops, or rubs. ABDOMEN: Soft, non-tender, No Masses. EXTREMITIES: Moves all extremities well.  Without clubbing, cyanosis, or  edema. NEUROLOGIC:  Alert and oriented x 3, normal gait, CN II-XII grossly intact.  MENTAL STATUS:  Appropriate. BACK:  Non-tender to palpation.  No CVAT SKIN:  Warm, dry and intact.    Results: U/A:  >30 WBC, 11-30 RBC, mod bacteria  Results for orders placed or performed in visit on 01/05/21 (from the past 24 hour(s))  BLADDER SCAN AMB NON-IMAGING   Collection Time: 01/05/21  1:57 PM  Result Value Ref Range   Scan Result 67f

## 2021-01-05 NOTE — Progress Notes (Signed)
post void residual=0 Urological Symptom Review  Patient is experiencing the following symptoms: Frequent urination Get up at night to urinate   Review of Systems  Gastrointestinal (upper)  : Negative for upper GI symptoms  Gastrointestinal (lower) : Negative for lower GI symptoms  Constitutional : Negative for symptoms  Skin: Negative for skin symptoms  Eyes: Negative for eye symptoms  Ear/Nose/Throat : Negative for Ear/Nose/Throat symptoms  Hematologic/Lymphatic: Negative for Hematologic/Lymphatic symptoms  Cardiovascular : Ankle swelling  Respiratory : Negative for respiratory symptoms  Endocrine: Negative for endocrine symptoms  Musculoskeletal: Negative for musculoskeletal symptoms  Neurological: Negative for neurological symptoms  Psychologic: Negative for psychiatric symptoms

## 2021-01-07 LAB — URINE CULTURE

## 2021-01-08 MED ORDER — CIPROFLOXACIN HCL 500 MG PO TABS: 500.0000 mg | ORAL_TABLET | Freq: Two times a day (BID) | ORAL | 0 refills | Status: AC

## 2021-01-08 NOTE — Addendum Note (Signed)
Addended by: Primus Bravo on: 01/08/2021 05:17 PM   Modules accepted: Orders

## 2021-01-09 ENCOUNTER — Telehealth: Payer: Self-pay

## 2021-01-09 NOTE — Telephone Encounter (Signed)
Patient called and notified.

## 2021-01-09 NOTE — Telephone Encounter (Signed)
-----   Message from Primus Bravo, MD sent at 01/08/2021  5:16 PM EDT ----- Please notify patient that her urine culture did show evidence of UTI.  Prescription for Cipro x3 days sent to her pharmacy for treatment.

## 2021-01-12 ENCOUNTER — Telehealth: Payer: Self-pay

## 2021-01-12 NOTE — Telephone Encounter (Signed)
Patient called needing a refill on medication.  Medication: mirabegron ER (MYRBETRIQ) 50 MG TB24 tablet  Pharmacy: [facility], [place]

## 2021-01-12 NOTE — Telephone Encounter (Signed)
Patient came by the office today and we discussed the myrbetriq samples to take daily. Patient voiced understanding  Patient reports after her 3 doses of cipro she notice her stool to look like "horse feed" and has abd pain. Please advise if she needs treatment for possible c-diff.

## 2021-01-14 NOTE — Telephone Encounter (Signed)
I called patient to discuss her symptoms. No answer. Mailbox full.

## 2021-01-21 ENCOUNTER — Telehealth: Payer: Self-pay

## 2021-01-21 NOTE — Telephone Encounter (Signed)
Pt called is having some diaherra and wanted to know what medication to take to help. Advised pt take otc imodium , pt was concern if this medication would help or not. Advised pt to try Imodium if doesn't help to call us back.

## 2021-02-02 ENCOUNTER — Other Ambulatory Visit: Payer: Self-pay

## 2021-02-02 ENCOUNTER — Encounter: Payer: Self-pay | Admitting: Urology

## 2021-02-02 ENCOUNTER — Ambulatory Visit: Payer: PPO | Admitting: Urology

## 2021-02-02 VITALS — BP 148/77 | HR 100

## 2021-02-02 DIAGNOSIS — Z87440 Personal history of urinary (tract) infections: Secondary | ICD-10-CM

## 2021-02-02 DIAGNOSIS — R8290 Unspecified abnormal findings in urine: Secondary | ICD-10-CM

## 2021-02-02 DIAGNOSIS — N3946 Mixed incontinence: Secondary | ICD-10-CM

## 2021-02-02 LAB — MICROSCOPIC EXAMINATION
Epithelial Cells (non renal): >10 /hpf — AB (ref 0–10)
Renal Epithel, UA: NONE SEEN /hpf
WBC, UA: >30 /hpf — AB (ref 0–5)

## 2021-02-02 LAB — URINALYSIS, ROUTINE W REFLEX MICROSCOPIC
Bilirubin, UA: NEGATIVE
Glucose, UA: NEGATIVE
Ketones, UA: NEGATIVE
Nitrite, UA: NEGATIVE
Specific Gravity, UA: 1.02 (ref 1.005–1.030)
Urobilinogen, Ur: 0.2 mg/dL (ref 0.2–1.0)
pH, UA: 6 (ref 5.0–7.5)

## 2021-02-02 NOTE — Progress Notes (Signed)
Urological Symptom Review  Patient is experiencing the following symptoms: Negative   Review of Systems  Gastrointestinal (upper)  : Negative for upper GI symptoms  Gastrointestinal (lower) : Diarrhea  Constitutional : Negative for symptoms  Skin: Negative for skin symptoms  Eyes: Negative for eye symptoms  Ear/Nose/Throat : Negative for Ear/Nose/Throat symptoms   Hematologic/Lymphatic: Negative for Hematologic/Lymphatic symptoms  Cardiovascular : Negative for cardiovascular symptoms  Respiratory : Negative for respiratory symptoms  Endocrine: Negative for endocrine symptoms  Musculoskeletal: Negative for musculoskeletal symptoms  Neurological: Negative for neurological symptoms  Psychologic: Negative for psychiatric symptoms

## 2021-02-02 NOTE — Progress Notes (Signed)
Assessment: 1. Mixed incontinence   2. History of UTI   3. Abnormal urine findings     Plan: Urine culture today - will call with results and recommendations for treatment Continue Kegel exercises  Will hold on a trial of a different medication for her urge incontinence due to her current GI issues Recommend follow-up with her PCP for apparent GI issues as these should not be related to the Myrbetriq stopped over a week ago. Return to office in 3-4 weeks  Chief Complaint:  Chief Complaint  Patient presents with   Urinary Incontinence     History of Present Illness:  Linda Williamson is a 86 y.o. year old female who is seen for further evaluation of urinary incontinence.  At her initial visit on 10/3/122, she reported a history of stress incontinence for a number of years.  She recently had worsening urge incontinence, most noticeable after laughing.  She noted urgency with associated incontinence.  She was using at least 3 poise pads per day.  She reported nighttime incontinence and nocturia x3.  No dysuria or gross hematuria.  No recent UTIs.  No prior medical therapy for her symptoms.  No problems with constipation. She was given a trial of Myrbetriq 50 mg daily in 10/22. Urine cx: 10-25 K Staph.  Treated with Cipro x 3 days.  She returns today for follow-up.  She has discontinued the Myrbetriq as she felt it was causing GI problems.  She was having problems with diarrhea.  She stopped the medication over a week ago.  She continues to have some diffuse abdominal discomfort after eating and irregular bowel movements.  She continues to experience incontinence.  She completed the Cipro.  No current dysuria or gross hematuria.  She does report a vaginal discharge and odor.  Portions of the above documentation were copied from a prior visit for review purposes only.  Past Medical History:  Past Medical History:  Diagnosis Date   Bilateral lower extremity edema    Bilateral lower  extremity edema 04/2013   Cancer ([place])    Cataract    Hypertension    Thyroid disease     Past Surgical History:  Past Surgical History:  Procedure Laterality Date   ABDOMINAL HYSTERECTOMY     APPENDECTOMY     CATARACT EXTRACTION     CHOLECYSTECTOMY     THROAT SURGERY      Allergies:  Allergies  Allergen Reactions   Keflex [Cephalexin]    Penicillins Rash    Family History:  Family History  Problem Relation Age of Onset   Heart failure Mother    Cancer Father        Bladder   COPD Brother    Heart disease Sister    COPD Brother     Social History:  Social History   Tobacco Use   Smoking status: Former    Packs/day: 0.50    Types: Cigarettes    Quit date: 02/01/2019    Years since quitting: 2.0   Smokeless tobacco: Never  Vaping Use   Vaping Use: Never used  Substance Use Topics   Alcohol use: Yes    Comment: glass of wine occasionally    Drug use: No    ROS: Constitutional:  Negative for fever, chills, weight loss CV: Negative for chest pain, previous MI, hypertension Respiratory:  Negative for shortness of breath, wheezing, sleep apnea, frequent cough GI:  Negative for nausea, vomiting, bloody stool, GERD  Physical exam: BP Marland Kitchen)  148/77   Pulse 100  GENERAL APPEARANCE:  Well appearing, well developed, well nourished, NAD HEENT:  Atraumatic, normocephalic, oropharynx clear NECK:  Supple without lymphadenopathy or thyromegaly ABDOMEN:  Soft, non-tender, no masses EXTREMITIES:  Moves all extremities well, without clubbing, cyanosis, or edema NEUROLOGIC:  Alert and oriented x 3, normal gait, CN II-XII grossly intact MENTAL STATUS:  appropriate BACK:  Non-tender to palpation, No CVAT SKIN:  Warm, dry, and intact   Results: U/A:  >30 WBC, 11-30 RBC, nitrite negative, mod bacteria

## 2021-02-03 ENCOUNTER — Telehealth: Payer: Self-pay

## 2021-02-03 NOTE — Telephone Encounter (Signed)
Patient needing to discuss recent urine culture. Not understanding what is causing the infection(s).  Please call pt back at: [phone] Lemmie Evens)  [phone] ([phone]   Or call friend Hank at [phone] if pt cannot be reached at those numbers.  Also wants to know a good primary care physician in our area.  Thanks, Helene Kelp

## 2021-02-03 NOTE — Telephone Encounter (Signed)
Returned patient call and advise urine culture has not yet resulted. Patient voiced understanding.

## 2021-02-04 LAB — URINE CULTURE

## 2021-02-04 NOTE — Telephone Encounter (Signed)
-----   Message from Primus Bravo, MD sent at 02/04/2021 11:02 AM EST ----- Please notify patient that her urine culture did not show evidence of a UTI.  No antibiotics are necessary.

## 2021-02-04 NOTE — Telephone Encounter (Signed)
Patient called and made aware.

## 2021-02-23 DIAGNOSIS — N950 Postmenopausal bleeding: Secondary | ICD-10-CM | POA: Diagnosis not present

## 2021-03-04 ENCOUNTER — Ambulatory Visit: Payer: PPO | Admitting: Urology

## 2021-03-04 NOTE — Progress Notes (Deleted)
° °  Assessment: 1. History of UTI   2. Mixed incontinence     Plan: Continue Kegel exercises  Will hold on a trial of a different medication for her urge incontinence due to her current GI issues Recommend follow-up with her PCP for apparent GI issues as these should not be related to the Myrbetriq stopped over a week ago. Return to office in 3-4 weeks  Chief Complaint:  No chief complaint on file.    History of Present Illness:  Linda Williamson is a 86 y.o. year old female who is seen for further evaluation of urinary incontinence.  At her initial visit on 10/3/122, she reported a history of stress incontinence for a number of years.  She recently had worsening urge incontinence, most noticeable after laughing.  She noted urgency with associated incontinence.  She was using at least 3 poise pads per day.  She reported nighttime incontinence and nocturia x3.  No dysuria or gross hematuria.  No recent UTIs.  No prior medical therapy for her symptoms.  No problems with constipation. She was given a trial of Myrbetriq 50 mg daily in 10/22. Urine cx: 10-25 K Staph.  Treated with Cipro x 3 days.  At her return visit in 11/22, she had discontinued the Myrbetriq as she felt it was causing GI problems with diarrhea.  She had stopped the medication over a week prior to her visit but continued to have some diffuse abdominal discomfort after eating and irregular bowel movements.  She continued to experience incontinence.  She had completed the Cipro.  No dysuria or gross hematuria.  She reported a vaginal discharge and odor. Urine culture from 11/22 showed <10K colonies of mixed flora.  Portions of the above documentation were copied from a prior visit for review purposes only.  Past Medical History:  Past Medical History:  Diagnosis Date   Bilateral lower extremity edema    Bilateral lower extremity edema 04/2013   Cancer ([place])    Cataract    Hypertension    Thyroid disease     Past  Surgical History:  Past Surgical History:  Procedure Laterality Date   ABDOMINAL HYSTERECTOMY     APPENDECTOMY     CATARACT EXTRACTION     CHOLECYSTECTOMY     THROAT SURGERY      Allergies:  Allergies  Allergen Reactions   Keflex [Cephalexin]    Penicillins Rash    Family History:  Family History  Problem Relation Age of Onset   Heart failure Mother    Cancer Father        Bladder   COPD Brother    Heart disease Sister    COPD Brother     Social History:  Social History   Tobacco Use   Smoking status: Former    Packs/day: 0.50    Types: Cigarettes    Quit date: 02/01/2019    Years since quitting: 2.0   Smokeless tobacco: Never  Vaping Use   Vaping Use: Never used  Substance Use Topics   Alcohol use: Yes    Comment: glass of wine occasionally    Drug use: No    ROS: Constitutional:  Negative for fever, chills, weight loss CV: Negative for chest pain, previous MI, hypertension Respiratory:  Negative for shortness of breath, wheezing, sleep apnea, frequent cough GI:  Negative for nausea, vomiting, bloody stool, GERD  Physical exam: There were no vitals taken for this visit. ***  Results: ***

## 2021-03-05 DIAGNOSIS — N3946 Mixed incontinence: Secondary | ICD-10-CM | POA: Diagnosis not present

## 2021-03-05 DIAGNOSIS — R5383 Other fatigue: Secondary | ICD-10-CM | POA: Diagnosis not present

## 2021-03-05 DIAGNOSIS — Z760 Encounter for issue of repeat prescription: Secondary | ICD-10-CM | POA: Diagnosis not present

## 2021-03-05 DIAGNOSIS — K529 Noninfective gastroenteritis and colitis, unspecified: Secondary | ICD-10-CM | POA: Diagnosis not present

## 2021-03-05 DIAGNOSIS — N920 Excessive and frequent menstruation with regular cycle: Secondary | ICD-10-CM | POA: Diagnosis not present

## 2021-03-05 DIAGNOSIS — E039 Hypothyroidism, unspecified: Secondary | ICD-10-CM | POA: Diagnosis not present

## 2021-03-05 DIAGNOSIS — I10 Essential (primary) hypertension: Secondary | ICD-10-CM | POA: Diagnosis not present

## 2021-03-16 ENCOUNTER — Other Ambulatory Visit: Payer: Self-pay

## 2021-03-16 ENCOUNTER — Encounter (INDEPENDENT_AMBULATORY_CARE_PROVIDER_SITE_OTHER): Payer: Self-pay | Admitting: *Deleted

## 2021-03-16 ENCOUNTER — Ambulatory Visit: Payer: PPO | Admitting: Urology

## 2021-03-16 ENCOUNTER — Encounter: Payer: Self-pay | Admitting: Urology

## 2021-03-16 VITALS — BP 123/91 | HR 108 | Ht 69.0 in | Wt 175.0 lb

## 2021-03-16 DIAGNOSIS — N3946 Mixed incontinence: Secondary | ICD-10-CM

## 2021-03-16 DIAGNOSIS — Z87440 Personal history of urinary (tract) infections: Secondary | ICD-10-CM | POA: Diagnosis not present

## 2021-03-16 LAB — URINALYSIS, ROUTINE W REFLEX MICROSCOPIC
Bilirubin, UA: NEGATIVE
Glucose, UA: NEGATIVE
Nitrite, UA: NEGATIVE
Specific Gravity, UA: 1.025 (ref 1.005–1.030)
Urobilinogen, Ur: 0.2 mg/dL (ref 0.2–1.0)
pH, UA: 6 (ref 5.0–7.5)

## 2021-03-16 MED ORDER — GEMTESA 75 MG PO TABS
75.0000 mg | ORAL_TABLET | Freq: Every day | ORAL | 0 refills | Status: DC
Start: 2021-03-16 — End: 2021-04-24

## 2021-03-16 NOTE — Progress Notes (Signed)

## 2021-03-16 NOTE — Progress Notes (Signed)
Assessment: 1. Mixed incontinence   2. History of UTI      Plan: Continue Kegel exercises  Continue management of vaginal bleeding per GYN Trial of Gemtesa 75 mg daily. Samples given. Return to office in 1 month  Chief Complaint:  Chief Complaint  Patient presents with   Urinary Incontinence     History of Present Illness:  Linda Williamson is a 87 y.o. year old female who is seen for further evaluation of urinary incontinence.  At her initial visit on 10/3/122, she reported a history of stress incontinence for a number of years.  She recently had worsening urge incontinence, most noticeable after laughing.  She noted urgency with associated incontinence.  She was using at least 3 poise pads per day.  She reported nighttime incontinence and nocturia x3.  No dysuria or gross hematuria.  No recent UTIs.  No prior medical therapy for her symptoms.  No problems with constipation. She was given a trial of Myrbetriq 50 mg daily in 10/22. Urine cx: 10-25 K Staph.  Treated with Cipro x 3 days.  At her return visit in 11/22, she had discontinued the Myrbetriq as she felt it was causing GI problems with diarrhea.  She had stopped the medication over a week prior to her visit but continued to have some diffuse abdominal discomfort after eating and irregular bowel movements.  She continued to experience incontinence.  She had completed the Cipro.  No dysuria or gross hematuria.  She reported a vaginal discharge and odor. Urine culture from 11/22 showed <10K colonies of mixed flora.  She returns today for follow-up.  She continues to have some intermittent vaginal bleeding.  She was evaluated by gynecology and started on vaginal hormone cream.  Review of her chart indicates findings on pelvic exam showing a suture in the vaginal area, felt to be related to her prior incontinence procedure.  She is not having significant symptoms of incontinence at the present time.  She denies any dysuria or gross  hematuria. Portions of the above documentation were copied from a prior visit for review purposes only.  Past Medical History:  Past Medical History:  Diagnosis Date   Bilateral lower extremity edema    Bilateral lower extremity edema 04/2013   Cancer ([place])    Cataract    Hypertension    Thyroid disease     Past Surgical History:  Past Surgical History:  Procedure Laterality Date   ABDOMINAL HYSTERECTOMY     APPENDECTOMY     CATARACT EXTRACTION     CHOLECYSTECTOMY     THROAT SURGERY      Allergies:  Allergies  Allergen Reactions   Nitrofurantoin Hives   Keflex [Cephalexin]    Albuterol Palpitations   Penicillins Rash    Family History:  Family History  Problem Relation Age of Onset   Heart failure Mother    Cancer Father        Bladder   COPD Brother    Heart disease Sister    COPD Brother     Social History:  Social History   Tobacco Use   Smoking status: Former    Packs/day: 0.50    Types: Cigarettes    Quit date: 02/01/2019    Years since quitting: 2.1   Smokeless tobacco: Never  Vaping Use   Vaping Use: Never used  Substance Use Topics   Alcohol use: Yes    Comment: glass of wine occasionally    Drug use: No  ROS: Constitutional:  Negative for fever, chills, weight loss CV: Negative for chest pain, previous MI, hypertension Respiratory:  Negative for shortness of breath, wheezing, sleep apnea, frequent cough GI:  Negative for nausea, vomiting, bloody stool, GERD  Physical exam: BP (!) 123/91 (BP Location: Left Arm)    Pulse (!) 108    Ht 5\' 9"  (1.753 m)    Wt 175 lb (79.4 kg)    BMI 25.84 kg/m  GENERAL APPEARANCE:  Well appearing, well developed, well nourished, NAD HEENT:  Atraumatic, normocephalic, oropharynx clear NECK:  Supple without lymphadenopathy or thyromegaly ABDOMEN:  Soft, non-tender, no masses EXTREMITIES:  Moves all extremities well, without clubbing, cyanosis, or edema NEUROLOGIC:  Alert and oriented x 3, normal gait, CN  II-XII grossly intact MENTAL STATUS:  appropriate BACK:  Non-tender to palpation, No CVAT SKIN:  Warm, dry, and intact   Results: U/A dipstick:  2+ blood, 3+ LE

## 2021-03-19 DIAGNOSIS — R202 Paresthesia of skin: Secondary | ICD-10-CM | POA: Diagnosis not present

## 2021-03-19 DIAGNOSIS — G4700 Insomnia, unspecified: Secondary | ICD-10-CM | POA: Diagnosis not present

## 2021-03-19 DIAGNOSIS — I10 Essential (primary) hypertension: Secondary | ICD-10-CM | POA: Diagnosis not present

## 2021-03-19 DIAGNOSIS — Z79899 Other long term (current) drug therapy: Secondary | ICD-10-CM | POA: Diagnosis not present

## 2021-03-19 DIAGNOSIS — H6121 Impacted cerumen, right ear: Secondary | ICD-10-CM | POA: Diagnosis not present

## 2021-03-20 DIAGNOSIS — Z79899 Other long term (current) drug therapy: Secondary | ICD-10-CM | POA: Diagnosis not present

## 2021-03-20 DIAGNOSIS — D1801 Hemangioma of skin and subcutaneous tissue: Secondary | ICD-10-CM | POA: Diagnosis not present

## 2021-04-02 DIAGNOSIS — M25519 Pain in unspecified shoulder: Secondary | ICD-10-CM | POA: Diagnosis not present

## 2021-04-02 DIAGNOSIS — R413 Other amnesia: Secondary | ICD-10-CM | POA: Diagnosis not present

## 2021-04-02 DIAGNOSIS — F419 Anxiety disorder, unspecified: Secondary | ICD-10-CM | POA: Diagnosis not present

## 2021-04-02 DIAGNOSIS — G4700 Insomnia, unspecified: Secondary | ICD-10-CM | POA: Diagnosis not present

## 2021-04-02 DIAGNOSIS — M25511 Pain in right shoulder: Secondary | ICD-10-CM | POA: Diagnosis not present

## 2021-04-09 ENCOUNTER — Other Ambulatory Visit: Payer: Self-pay

## 2021-04-09 ENCOUNTER — Emergency Department (HOSPITAL_COMMUNITY)
Admission: EM | Admit: 2021-04-09 | Discharge: 2021-04-09 | Disposition: A | Discharge status: Home / Self Care | Payer: PPO | Attending: Emergency Medicine | Admitting: Emergency Medicine

## 2021-04-09 ENCOUNTER — Encounter (HOSPITAL_COMMUNITY): Payer: Self-pay | Admitting: Emergency Medicine

## 2021-04-09 ENCOUNTER — Emergency Department (HOSPITAL_COMMUNITY): Payer: PPO

## 2021-04-09 DIAGNOSIS — I672 Cerebral atherosclerosis: Secondary | ICD-10-CM | POA: Diagnosis not present

## 2021-04-09 DIAGNOSIS — R29818 Other symptoms and signs involving the nervous system: Secondary | ICD-10-CM | POA: Diagnosis not present

## 2021-04-09 DIAGNOSIS — Z20822 Contact with and (suspected) exposure to covid-19: Secondary | ICD-10-CM | POA: Insufficient documentation

## 2021-04-09 DIAGNOSIS — I6523 Occlusion and stenosis of bilateral carotid arteries: Secondary | ICD-10-CM | POA: Diagnosis not present

## 2021-04-09 DIAGNOSIS — Z79899 Other long term (current) drug therapy: Secondary | ICD-10-CM | POA: Diagnosis not present

## 2021-04-09 DIAGNOSIS — I10 Essential (primary) hypertension: Secondary | ICD-10-CM | POA: Diagnosis not present

## 2021-04-09 DIAGNOSIS — R600 Localized edema: Secondary | ICD-10-CM | POA: Insufficient documentation

## 2021-04-09 DIAGNOSIS — I779 Disorder of arteries and arterioles, unspecified: Secondary | ICD-10-CM

## 2021-04-09 DIAGNOSIS — R202 Paresthesia of skin: Secondary | ICD-10-CM | POA: Diagnosis not present

## 2021-04-09 DIAGNOSIS — N9489 Other specified conditions associated with female genital organs and menstrual cycle: Secondary | ICD-10-CM | POA: Insufficient documentation

## 2021-04-09 DIAGNOSIS — R200 Anesthesia of skin: Secondary | ICD-10-CM

## 2021-04-09 DIAGNOSIS — Z859 Personal history of malignant neoplasm, unspecified: Secondary | ICD-10-CM | POA: Diagnosis not present

## 2021-04-09 DIAGNOSIS — I6503 Occlusion and stenosis of bilateral vertebral arteries: Secondary | ICD-10-CM | POA: Diagnosis not present

## 2021-04-09 LAB — RAPID URINE DRUG SCREEN, HOSP PERFORMED
Amphetamines: NOT DETECTED
Barbiturates: NOT DETECTED
Benzodiazepines: NOT DETECTED
Cocaine: NOT DETECTED
Opiates: NOT DETECTED
Tetrahydrocannabinol: NOT DETECTED

## 2021-04-09 LAB — COMPREHENSIVE METABOLIC PANEL
ALT: 16 U/L (ref 0–44)
AST: 21 U/L (ref 15–41)
Albumin: 4.2 g/dL (ref 3.5–5.0)
Alkaline Phosphatase: 43 U/L (ref 38–126)
Anion gap: 9 (ref 5–15)
BUN: 16 mg/dL (ref 8–23)
CO2: 25 mmol/L (ref 22–32)
Calcium: 9.6 mg/dL (ref 8.9–10.3)
Chloride: 104 mmol/L (ref 98–111)
Creatinine, Ser: 0.75 mg/dL (ref 0.44–1.00)
GFR, Estimated: >60 mL/min (ref >60)
Glucose, Bld: 102 mg/dL — ABNORMAL HIGH (ref 70–99)
Potassium: 4.2 mmol/L (ref 3.5–5.1)
Sodium: 138 mmol/L (ref 135–145)
Total Bilirubin: 1 mg/dL (ref 0.3–1.2)
Total Protein: 7.2 g/dL (ref 6.5–8.1)

## 2021-04-09 LAB — DIFFERENTIAL
Abs Immature Granulocytes: 0.03 10*3/uL (ref 0.00–0.07)
Basophils Absolute: 0.1 10*3/uL (ref 0.0–0.1)
Basophils Relative: 1 %
Eosinophils Absolute: 0.1 10*3/uL (ref 0.0–0.5)
Eosinophils Relative: 1 %
Immature Granulocytes: 0 %
Lymphocytes Relative: 26 %
Lymphs Abs: 1.8 10*3/uL (ref 0.7–4.0)
Monocytes Absolute: 0.6 10*3/uL (ref 0.1–1.0)
Monocytes Relative: 9 %
Neutro Abs: 4.3 10*3/uL (ref 1.7–7.7)
Neutrophils Relative %: 63 %

## 2021-04-09 LAB — I-STAT CHEM 8, ED
BUN: 15 mg/dL (ref 8–23)
Calcium, Ion: 1.1 mmol/L — ABNORMAL LOW (ref 1.15–1.40)
Chloride: 104 mmol/L (ref 98–111)
Creatinine, Ser: 0.9 mg/dL (ref 0.44–1.00)
Glucose, Bld: 101 mg/dL — ABNORMAL HIGH (ref 70–99)
HCT: 41 % (ref 36.0–46.0)
Hemoglobin: 13.9 g/dL (ref 12.0–15.0)
Potassium: 4.2 mmol/L (ref 3.5–5.1)
Sodium: 138 mmol/L (ref 135–145)
TCO2: 25 mmol/L (ref 22–32)

## 2021-04-09 LAB — CBC
HCT: 41.8 % (ref 36.0–46.0)
Hemoglobin: 13.3 g/dL (ref 12.0–15.0)
MCH: 30.8 pg (ref 26.0–34.0)
MCHC: 31.8 g/dL (ref 30.0–36.0)
MCV: 96.8 fL (ref 80.0–100.0)
Platelets: 196 10*3/uL (ref 150–400)
RBC: 4.32 MIL/uL (ref 3.87–5.11)
RDW: 12.9 % (ref 11.5–15.5)
WBC: 6.9 10*3/uL (ref 4.0–10.5)
nRBC: 0 % (ref 0.0–0.2)

## 2021-04-09 LAB — RESP PANEL BY RT-PCR (FLU A&B, COVID) ARPGX2
Influenza A by PCR: NEGATIVE
Influenza B by PCR: NEGATIVE
SARS Coronavirus 2 by RT PCR: NEGATIVE

## 2021-04-09 LAB — URINALYSIS, ROUTINE W REFLEX MICROSCOPIC
Bilirubin Urine: NEGATIVE
Glucose, UA: NEGATIVE mg/dL
Ketones, ur: NEGATIVE mg/dL
Nitrite: NEGATIVE
Protein, ur: NEGATIVE mg/dL
Specific Gravity, Urine: 1.02 (ref 1.005–1.030)
pH: 5.5 (ref 5.0–8.0)

## 2021-04-09 LAB — URINALYSIS, MICROSCOPIC (REFLEX): RBC / HPF: NONE SEEN RBC/hpf (ref 0–5)

## 2021-04-09 LAB — APTT: aPTT: 26 seconds (ref 24–36)

## 2021-04-09 LAB — PROTIME-INR
INR: 0.9 (ref 0.8–1.2)
Prothrombin Time: 12.5 seconds (ref 11.4–15.2)

## 2021-04-09 LAB — ETHANOL: Alcohol, Ethyl (B): 10 mg/dL — ABNORMAL HIGH (ref <10)

## 2021-04-09 MED ORDER — CLOPIDOGREL BISULFATE 75 MG PO TABS: 75.0000 mg | ORAL_TABLET | Freq: Every day | ORAL | 0 refills | Status: DC

## 2021-04-09 MED ORDER — IOHEXOL 350 MG/ML SOLN
80.0000 mL | Freq: Once | INTRAVENOUS | Status: AC | PRN
  Administered 2021-04-09: 80 mL via INTRAVENOUS

## 2021-04-09 MED ORDER — ASPIRIN 81 MG PO CHEW
324.0000 mg | CHEWABLE_TABLET | Freq: Once | ORAL | Status: AC
Start: 2021-04-09 — End: 2021-04-09
  Administered 2021-04-09: 324 mg via ORAL
  Filled 2021-04-09: qty 4

## 2021-04-09 MED ORDER — CLOPIDOGREL BISULFATE 75 MG PO TABS
75.0000 mg | ORAL_TABLET | Freq: Once | ORAL | Status: AC
Start: 2021-04-09 — End: 2021-04-09
  Administered 2021-04-09: 75 mg via ORAL
  Filled 2021-04-09: qty 1

## 2021-04-09 NOTE — ED Notes (Signed)
Pt to MRI

## 2021-04-09 NOTE — ED Triage Notes (Signed)
Pt to the ED with left leg numbness that began a few days ago. Pt ambulated to the room and denies pain.

## 2021-04-09 NOTE — Discharge Instructions (Addendum)
Your testing today shows that you have a blockage in your neck, because of this you will need to take some blood thinning medications including the following  Aspirin, 81 mg a day Clopidogrel, 75 mg/day  Take these medications for 3 weeks and then follow-up with your family doctor and a neurologist at the end of that 3 weeks.  In the meantime if you should develop severe or worsening symptoms including increasing numbness, weakness, difficulty with speech, vision or balance you are to return to the emergency department immediately.

## 2021-04-09 NOTE — ED Provider Notes (Signed)
This patient was excepted at change of shift in signout from Dr. Shea Stakes.  Patient arrives with left leg numbness but no weakness, ambulated into the emergency department.  On exam has very little if any residual numbness, normal motor exam, MRI showed possible vertebral artery abnormality but no ischemia of the brain, CT angio of the neck showed right-sided vertebral artery abnormalities.  This was discussed with Dr. Quinn Axe of the neurology service at 4:57 PM, she recommends dual antiplatelet therapy for 21 days and follow-up with neurology, she does not recommend admission to the hospital given the negative MRI.  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

## 2021-04-09 NOTE — ED Notes (Signed)
Pt passed stroke swallow screen test

## 2021-04-09 NOTE — ED Provider Notes (Addendum)
[facility] EMERGENCY DEPARTMENT Provider Note   CSN: [id number] Arrival date & time: 04/09/21  2025     History  Chief Complaint  Patient presents with   leg numbness    Linda Williamson is a 87 y.o. female.  Patient with a complaint of left leg numbness more lateral part of the leg and entire leg all the way down to the foot.  Denies any pain.  No back pain.  Onset was 2 to 3 days ago.  Patient able to ambulate.  Patient denies any history of similar pain.  Denies any extremity numbness any facial numbness any headache any fevers any visual changes any speech problems denies any weakness.  Denies any incontinence.  Past medical history significant for thyroid disease hypertension bilateral lower extremity edema and a history of cancer nonspecific.  Patient's had her gallbladder removed appendix removed has had a hysterectomy.  Patient is a former smoker quit in 2020      Home Medications Prior to Admission medications   Medication Sig Start Date End Date Taking? Authorizing Provider  albuterol (VENTOLIN HFA) 108 (90 Base) MCG/ACT inhaler Inhale 2 puffs into the lungs every 6 (six) hours as needed for wheezing or shortness of breath. 02/09/19  Yes Shah, Pratik D, DO  amLODipine (NORVASC) 5 MG tablet Take 5 mg by mouth daily. 05/04/16  Yes [provider]  CALCIUM PO Take 1 tablet by mouth daily.   Yes [provider]  Cyanocobalamin (B-12 PO) Take 1 tablet by mouth daily.   Yes [provider]  furosemide (LASIX) 20 MG tablet Take 20 mg by mouth daily. 10/16/20  Yes [provider]  levothyroxine (SYNTHROID) 75 MCG tablet Take 1 tablet (75 mcg total) by mouth daily. 02/09/19 04/09/21 Yes Shah, Pratik D, DO  LORazepam (ATIVAN) 0.5 MG tablet Take 0.25 mg by mouth at bedtime as needed for anxiety. 03/20/21  Yes [provider]  naproxen sodium (ALEVE) 220 MG tablet Take 220 mg by mouth 2 (two) times daily as needed (pain).   Yes [provider]  zolpidem (AMBIEN) 5 MG tablet Take 5 mg by mouth at bedtime. 04/02/21  Yes [provider]  nicotine (NICODERM CQ - DOSED IN MG/24 HR) 7 mg/24hr patch Place 1 patch (7 mg total) onto the skin daily. Patient not taking: Reported on 04/09/2021 02/10/19   Heath Lark D, DO  Vibegron (GEMTESA) 75 MG TABS Take 75 mg by mouth daily. Patient not taking: Reported on 04/09/2021 03/16/21   Primus Bravo., MD  VITAMIN E PO Take 1 tablet by mouth daily.    [provider]      Allergies    Nitrofurantoin, Keflex [cephalexin], Albuterol, and Penicillins    Review of Systems   Review of Systems  Constitutional:  Negative for chills and fever.  HENT:  Negative for ear pain and sore throat.   Eyes:  Negative for pain and visual disturbance.  Respiratory:  Negative for cough and shortness of breath.   Cardiovascular:  Negative for chest pain and palpitations.  Gastrointestinal:  Negative for abdominal pain and vomiting.  Genitourinary:  Negative for dysuria and hematuria.  Musculoskeletal:  Negative for arthralgias and back pain.  Skin:  Negative for color change and rash.  Neurological:  Positive for numbness. Negative for dizziness, seizures, syncope, facial asymmetry, speech difficulty, weakness and headaches.  All other systems reviewed and are negative.  Physical Exam Updated Vital Signs BP (!) 151/60 (BP Location: Left Arm)  Pulse 76    Temp 98.2 F (36.8 C) (Oral)    Resp 17    Ht 1.753 m (5\' 9" )    Wt 79.4 kg    SpO2 96%    BMI 25.85 kg/m  Physical Exam Vitals and nursing note reviewed.  Constitutional:      General: She is not in acute distress.    Appearance: Normal appearance. She is well-developed.  HENT:     Head: Normocephalic and atraumatic.  Eyes:     Extraocular Movements: Extraocular movements intact.     Conjunctiva/sclera: Conjunctivae normal.     Pupils: Pupils are equal, round, and reactive to light.  Cardiovascular:     Rate and Rhythm: Normal  rate and regular rhythm.     Heart sounds: No murmur heard. Pulmonary:     Effort: Pulmonary effort is normal. No respiratory distress.     Breath sounds: Normal breath sounds.  Abdominal:     Palpations: Abdomen is soft.     Tenderness: There is no abdominal tenderness.  Musculoskeletal:        General: No swelling.     Cervical back: Normal range of motion and neck supple.     Comments: Trace edema to bilateral lower extremities.  Dorsalis pedis pulses 1+ bilaterally.  Excellent strength of toe flexion and extension.  Also straight leg raise with good strength no pain in the back.  Back nontender to palpation.  Skin:    General: Skin is warm and dry.     Capillary Refill: Capillary refill takes less than 2 seconds.  Neurological:     Mental Status: She is alert.     Cranial Nerves: No cranial nerve deficit.     Sensory: Sensory deficit present.     Motor: No weakness.     Coordination: Coordination normal.     Comments: No upper extremity or lower extremity weakness.  No facial asymmetry.  Patient with more subjective numbness to the left lower extremity.  Psychiatric:        Mood and Affect: Mood normal.    ED Results / Procedures / Treatments   Labs (all labs ordered are listed, but only abnormal results are displayed) Labs Reviewed  ETHANOL - Abnormal; Notable for the following components:      Result Value   Alcohol, Ethyl (B) 10 (*)    All other components within normal limits  COMPREHENSIVE METABOLIC PANEL - Abnormal; Notable for the following components:   Glucose, Bld 102 (*)    All other components within normal limits  URINALYSIS, ROUTINE W REFLEX MICROSCOPIC - Abnormal; Notable for the following components:   Hgb urine dipstick SMALL (*)    Leukocytes,Ua LARGE (*)    All other components within normal limits  URINALYSIS, MICROSCOPIC (REFLEX) - Abnormal; Notable for the following components:   Bacteria, UA RARE (*)    Non Squamous Epithelial PRESENT (*)    All  other components within normal limits  I-STAT CHEM 8, ED - Abnormal; Notable for the following components:   Glucose, Bld 101 (*)    Calcium, Ion 1.10 (*)    All other components within normal limits  RESP PANEL BY RT-PCR (FLU A&B, COVID) ARPGX2  PROTIME-INR  APTT  CBC  DIFFERENTIAL  RAPID URINE DRUG SCREEN, HOSP PERFORMED    EKG EKG Interpretation  Date/Time:  Thursday April 09 2021 09:41:14 EST Ventricular Rate:  70 PR Interval:  169 QRS Duration: 149 QT Interval:  461 QTC Calculation: 498 R  Axis:   40 Text Interpretation: Sinus rhythm Left bundle branch block New since previous tracing Confirmed by Fredia Sorrow [phone]) on 04/09/2021 9:48:08 AM  Radiology CT HEAD WO CONTRAST  Result Date: 04/09/2021 CLINICAL DATA:  Acute left lower extremity numbness. EXAM: CT HEAD WITHOUT CONTRAST TECHNIQUE: Contiguous axial images were obtained from the base of the skull through the vertex without intravenous contrast. RADIATION DOSE REDUCTION: This exam was performed according to the departmental dose-optimization program which includes automated exposure control, adjustment of the mA and/or kV according to patient size and/or use of iterative reconstruction technique. COMPARISON:  None. FINDINGS: Brain: Mild chronic ischemic white matter disease is noted. No mass effect or midline shift is noted. Ventricular size is within normal limits. There is no evidence of mass lesion, hemorrhage or acute infarction. Vascular: No hyperdense vessel or unexpected calcification. Skull: Normal. Negative for fracture or focal lesion. Sinuses/Orbits: No acute finding. Other: None. IMPRESSION: No acute intracranial abnormality seen. Electronically Signed   By: Marijo Conception M.D.   On: 04/09/2021 09:23   MR Brain Wo Contrast (neuro protocol)  Result Date: 04/09/2021 CLINICAL DATA:  Neuro deficit, acute, stroke suspected Left lower extremity numbness without pain. Head CT negative EXAM: MRI HEAD WITHOUT  CONTRAST TECHNIQUE: Multiplanar, multiecho pulse sequences of the brain and surrounding structures were obtained without intravenous contrast. COMPARISON:  CT head same day. FINDINGS: Brain: No acute infarction, hemorrhage, hydrocephalus, extra-axial collection or mass lesion. Advanced confluent and patchy T2/FLAIR hyperintensity within the supratentorial and pontine white matter, nonspecific compatible with chronic microvascular disease. Vascular: Abnormal signal within the right intradural vertebral artery. Otherwise, major arterial flow voids are maintained. Skull and upper cervical spine: Normal marrow signal. Sinuses/Orbits: Largely clear sinuses.  Unremarkable orbits. Other: No mastoid effusions. IMPRESSION: 1. No evidence of acute intracranial abnormality. 2. Abnormal signal within the right intradural vertebral artery, which could relate to underlying stenosis, occlusion or artifact. A CTA could further evaluate if clinically indicated. 3. Advanced chronic microvascular ischemic disease. Electronically Signed   By: Margaretha Sheffield M.D.   On: 04/09/2021 13:25    Procedures Procedures    Medications Ordered in ED Medications - No data to display  ED Course/ Medical Decision Making/ A&P                           Medical Decision Making Amount and/or Complexity of Data Reviewed Labs: ordered. Radiology: ordered.  CRITICAL CARE Performed by: Fredia Sorrow Total critical care time: 40 minutes Critical care time was exclusive of separately billable procedures and treating other patients. Critical care was necessary to treat or prevent imminent or life-threatening deterioration. Critical care was time spent personally by me on the following activities: development of treatment plan with patient and/or surrogate as well as nursing, discussions with consultants, evaluation of patient's response to treatment, examination of patient, obtaining history from patient or surrogate, ordering and  performing treatments and interventions, ordering and review of laboratory studies, ordering and review of radiographic studies, pulse oximetry and re-evaluation of patient's condition.  Potential stroke since there is no pain.  Distribution of the chest left lower extremity with numbness which seems unusual.  Will initiate stroke protocol not code stroke.  Not a tPA candidate based on time.  Presentation not consistent with a large vessel occlusion.  Will start with head CT and then will go on to MRI.  Does not seem to be a pinched nerve in the back.  There  is no pain there is no back pain.  Patient's labs very reassuring.  Alcohol less than 10 INR normal no leukocytosis hemoglobin is 13.3.  Complete metabolic panel glucose 027 potassium 4.2 renal function normal with a GFR greater than 60 COVID panel and flu panel negative.  Urine drug screen negative.  Urinalysis still in process.  CT head had no acute findings we went on to MRI brain.  MRI brain has no evidence of any acute intercranial abnormality there is an abnormal signal within the right intradural vertebral artery which could relate to underlying stenosis occlusion or artifact CTA would be helpful to evaluate if clinically indicated.  Discussed with patient and they want to go ahead and go forward with the CTA head and neck.  It is ordered.  Disposition will be based on the CT angio head and neck discussed with Dr. Sabra Heck the evening physician.  He will follow-up.  If it has no significant findings related to today patient's good for discharge and follow-up.   Final Clinical Impression(s) / ED Diagnoses Final diagnoses:  Left leg numbness    Rx / DC Orders ED Discharge Orders     None         Fredia Sorrow, MD 04/09/21 1423    Fredia Sorrow, MD 04/09/21 [phone]

## 2021-04-16 ENCOUNTER — Ambulatory Visit: Payer: PPO | Admitting: Urology

## 2021-04-24 ENCOUNTER — Other Ambulatory Visit: Payer: Self-pay

## 2021-04-24 ENCOUNTER — Encounter: Payer: Self-pay | Admitting: Urology

## 2021-04-24 ENCOUNTER — Ambulatory Visit: Payer: PPO | Admitting: Urology

## 2021-04-24 DIAGNOSIS — Z87440 Personal history of urinary (tract) infections: Secondary | ICD-10-CM | POA: Diagnosis not present

## 2021-04-24 DIAGNOSIS — N3946 Mixed incontinence: Secondary | ICD-10-CM

## 2021-04-24 DIAGNOSIS — R8290 Unspecified abnormal findings in urine: Secondary | ICD-10-CM

## 2021-04-24 LAB — URINALYSIS, ROUTINE W REFLEX MICROSCOPIC
Bilirubin, UA: NEGATIVE
Glucose, UA: NEGATIVE
Ketones, UA: NEGATIVE
Nitrite, UA: NEGATIVE
Protein,UA: NEGATIVE
Specific Gravity, UA: 1.015 (ref 1.005–1.030)
Urobilinogen, Ur: 0.2 mg/dL (ref 0.2–1.0)
pH, UA: 6 (ref 5.0–7.5)

## 2021-04-24 LAB — MICROSCOPIC EXAMINATION
Epithelial Cells (non renal): >10 /hpf — AB (ref 0–10)
Renal Epithel, UA: NONE SEEN /hpf
WBC, UA: >30 /hpf — AB (ref 0–5)

## 2021-04-24 MED ORDER — GEMTESA 75 MG PO TABS: 75.0000 mg | ORAL_TABLET | Freq: Every day | ORAL | 11 refills | Status: DC

## 2021-04-24 NOTE — Progress Notes (Signed)
Assessment: 1. Mixed incontinence   2. History of UTI   3. Abnormal urine findings     Plan: Urine culture today Continue Kegel exercises  Continue Gemtesa 75 mg daily. Samples and rx given. Return to office in 6 weeks  Chief Complaint:  Chief Complaint  Patient presents with   Urinary Incontinence     History of Present Illness:  Linda Williamson is a 87 y.o. year old female who is seen for further evaluation of urinary incontinence.  At her initial visit on 10/3/122, she reported a history of stress incontinence for a number of years.  She recently had worsening urge incontinence, most noticeable after laughing.  She noted urgency with associated incontinence.  She was using at least 3 poise pads per day.  She reported nighttime incontinence and nocturia x3.  No dysuria or gross hematuria.  No recent UTIs.  No prior medical therapy for her symptoms.  No problems with constipation. She was given a trial of Myrbetriq 50 mg daily in 10/22. Urine cx: 10-25 K Staph.  Treated with Cipro x 3 days.  At her return visit in 11/22, she had discontinued the Myrbetriq as she felt it was causing GI problems with diarrhea.  She had stopped the medication over a week prior to her visit but continued to have some diffuse abdominal discomfort after eating and irregular bowel movements.  She continued to experience incontinence.  She had completed the Cipro.  No dysuria or gross hematuria.  She reported a vaginal discharge and odor. Urine culture from 11/22 showed <10K colonies of mixed flora.  At her visit in 1/23, she continued to have some intermittent vaginal bleeding.  She was evaluated by gynecology and started on vaginal hormone cream.  Review of her chart indicates findings on pelvic exam showing a suture in the vaginal area, felt to be related to her prior incontinence procedure.  She was not having significant symptoms of incontinence.  She denied any dysuria or gross hematuria.  She returns  today for follow-up. She continues on [place]  She has noted improvement in her incontinence with the medication.  No side effects.  No dysuria or gross hematuria.  Portions of the above documentation were copied from a prior visit for review purposes only.  Past Medical History:  Past Medical History:  Diagnosis Date   Bilateral lower extremity edema    Bilateral lower extremity edema 04/2013   Cancer ([facility])    Cataract    Hypertension    Thyroid disease     Past Surgical History:  Past Surgical History:  Procedure Laterality Date   ABDOMINAL HYSTERECTOMY     APPENDECTOMY     CATARACT EXTRACTION     CHOLECYSTECTOMY     THROAT SURGERY      Allergies:  Allergies  Allergen Reactions   Nitrofurantoin Hives   Keflex [Cephalexin]    Albuterol Palpitations   Penicillins Rash    Family History:  Family History  Problem Relation Age of Onset   Heart failure Mother    Cancer Father        Bladder   COPD Brother    Heart disease Sister    COPD Brother     Social History:  Social History   Tobacco Use   Smoking status: Former    Packs/day: 0.50    Types: Cigarettes    Quit date: 02/01/2019    Years since quitting: 2.2   Smokeless tobacco: Never  Vaping Use  Vaping Use: Never used  Substance Use Topics   Alcohol use: Yes    Comment: glass of wine occasionally    Drug use: No    ROS: Constitutional:  Negative for fever, chills, weight loss CV: Negative for chest pain, previous MI, hypertension Respiratory:  Negative for shortness of breath, wheezing, sleep apnea, frequent cough GI:  Negative for nausea, vomiting, bloody stool, GERD  Physical exam: There were no vitals taken for this visit. GENERAL APPEARANCE:  Well appearing, well developed, well nourished, NAD HEENT:  Atraumatic, normocephalic, oropharynx clear NECK:  Supple without lymphadenopathy or thyromegaly ABDOMEN:  Soft, non-tender, no masses EXTREMITIES:  Moves all extremities well, without  clubbing, cyanosis, or edema NEUROLOGIC:  Alert and oriented x 3, normal gait, CN II-XII grossly intact MENTAL STATUS:  appropriate BACK:  Non-tender to palpation, No CVAT SKIN:  Warm, dry, and intact   Results: U/A:  >30 WBC, 11-30 RBC, many bacteria

## 2021-04-26 LAB — URINE CULTURE

## 2021-04-30 DIAGNOSIS — I779 Disorder of arteries and arterioles, unspecified: Secondary | ICD-10-CM | POA: Diagnosis not present

## 2021-04-30 DIAGNOSIS — Z760 Encounter for issue of repeat prescription: Secondary | ICD-10-CM | POA: Diagnosis not present

## 2021-04-30 DIAGNOSIS — G4700 Insomnia, unspecified: Secondary | ICD-10-CM | POA: Diagnosis not present

## 2021-04-30 DIAGNOSIS — E785 Hyperlipidemia, unspecified: Secondary | ICD-10-CM | POA: Diagnosis not present

## 2021-04-30 DIAGNOSIS — R200 Anesthesia of skin: Secondary | ICD-10-CM | POA: Diagnosis not present

## 2021-05-05 ENCOUNTER — Ambulatory Visit (INDEPENDENT_AMBULATORY_CARE_PROVIDER_SITE_OTHER): Payer: PPO | Admitting: Gastroenterology

## 2021-05-05 ENCOUNTER — Other Ambulatory Visit: Payer: Self-pay

## 2021-05-05 ENCOUNTER — Encounter (INDEPENDENT_AMBULATORY_CARE_PROVIDER_SITE_OTHER): Payer: Self-pay | Admitting: Gastroenterology

## 2021-05-05 VITALS — BP 136/83 | HR 91 | Temp 98.0°F | Ht 68.0 in | Wt 179.3 lb

## 2021-05-05 DIAGNOSIS — K5900 Constipation, unspecified: Secondary | ICD-10-CM | POA: Insufficient documentation

## 2021-05-05 DIAGNOSIS — K760 Fatty (change of) liver, not elsewhere classified: Secondary | ICD-10-CM | POA: Insufficient documentation

## 2021-05-05 DIAGNOSIS — L538 Other specified erythematous conditions: Secondary | ICD-10-CM | POA: Diagnosis not present

## 2021-05-05 NOTE — Progress Notes (Signed)
Referring Provider: Jake Samples, PA* Primary Care Physician:  Wilburt Finlay, MD Primary GI Physician: new  Chief Complaint  Patient presents with   Diarrhea    Patient here today due to having bouts of diarrhea, and lower abdominal pain she describes as a stinging and burning sensation. She does take imodium when she has these episodes.   HPI:   Linda Williamson is a 87 y.o. female with past medical history of HTN, Thyroid disease.   Patient presenting today for diarrhea and abdominal pain.  Denies any current GI issues. Patient states that she has had a mix of both diarrhea and constipation. She states that recently, maybe the past month she has been having atleast one BM per day that is solid. Daughter who accompanies patient states that patient typically has constipation and then will use something to help with this, which will then cause her to have diarrhea. Typically drinks prune juice if she does experience constipation. Has some lower abdominal stinging, present x1 month, though she was recently diagnosed with UTI on 2/17 and started on Cipro for this. States that abdominal stinging is sometimes worse at night. She states that she really does not sleep very well at night so she is unable to determine if the pain ever wakes her up. Denies blood in stools or black stools. States her appetite is good, she mostly eats junk at home because she does not want to cook. She denies an unintentional weight loss. TSH 2.399 in December 2022,  Notably she has some palmar erythema that she says she typically notices after getting up and moving around in the morning.  LFTs, INR and platelet count WNL in early February, last imaging with some visualization of liver in 2021 showed hepatic steatosis. Patient has no LE edema, ascites, confusion, pruritus, or jaundice.   NSAID use: only 1 baby asa per day. Social hx: no tobacco, quit 1.5 years ago, 1-2 glasses of wine per week Fam hx:no CRC or  liver disease  Last Colonoscopy:never Last Endoscopy:2011 antral erosions/GERD. H pylori  Recommendations:  Screening colonoscopy   Past Medical History:  Diagnosis Date   Bilateral lower extremity edema    Bilateral lower extremity edema 04/2013   Cancer ([facility])    Cataract    Hypertension    Thyroid disease     Past Surgical History:  Procedure Laterality Date   ABDOMINAL HYSTERECTOMY     APPENDECTOMY     CATARACT EXTRACTION     CHOLECYSTECTOMY     THROAT SURGERY      Current Outpatient Medications  Medication Sig Dispense Refill   amLODipine (NORVASC) 5 MG tablet Take 5 mg by mouth daily.     Ascorbic Acid (VITAMIN C) 1000 MG tablet Take 1,000 mg by mouth daily.     CALCIUM PO Take 1 tablet by mouth daily.     clopidogrel (PLAVIX) 75 MG tablet Take 1 tablet (75 mg total) by mouth daily. 30 tablet 0   Cyanocobalamin (B-12 PO) Take 1 tablet by mouth daily.     furosemide (LASIX) 20 MG tablet Take 20 mg by mouth daily.     levothyroxine (SYNTHROID) 75 MCG tablet Take 1 tablet (75 mcg total) by mouth daily. 30 tablet 2   LORazepam (ATIVAN) 0.5 MG tablet Take 0.25 mg by mouth at bedtime as needed for anxiety. (Patient not taking: Reported on 05/05/2021)     Vibegron (GEMTESA) 75 MG TABS Take 75 mg by mouth daily. (Patient not  taking: Reported on 05/05/2021) 30 tablet 11   VITAMIN E PO Take 1 tablet by mouth daily.     zolpidem (AMBIEN) 5 MG tablet Take 5 mg by mouth at bedtime. (Patient not taking: Reported on 05/05/2021)     Current Facility-Administered Medications  Medication Dose Route Frequency Provider Last Rate Last Admin   triamcinolone acetonide (KENALOG) 10 MG/ML injection 10 mg  10 mg Other Once Wallene Huh, DPM        Allergies as of 05/05/2021 - Review Complete 05/05/2021  Allergen Reaction Noted   Nitrofurantoin Hives 02/23/2021   Keflex [cephalexin]  09/23/2018   Albuterol Palpitations 02/23/2021   Penicillins Rash 12/05/2009    Family History   Problem Relation Age of Onset   Heart failure Mother    Cancer Father        Bladder   COPD Brother    Heart disease Sister    COPD Brother     Social History   Socioeconomic History   Marital status: Widowed    Spouse name: Not on file   Number of children: 3   Years of education: Not on file   Highest education level: Not on file  Occupational History   Not on file  Tobacco Use   Smoking status: Former    Packs/day: 0.50    Types: Cigarettes    Quit date: 02/01/2019    Years since quitting: 2.2   Smokeless tobacco: Never  Vaping Use   Vaping Use: Never used  Substance and Sexual Activity   Alcohol use: Yes    Comment: glass of wine occasionally    Drug use: No   Sexual activity: Not Currently    Birth control/protection: None, Post-menopausal, Surgical    Comment: hyst  Other Topics Concern   Not on file  Social History Narrative   Not on file   Social Determinants of Health   Financial Resource Strain: Not on file  Food Insecurity: Not on file  Transportation Needs: Not on file  Physical Activity: Not on file  Stress: Not on file  Social Connections: Not on file   Review of systems General: negative for malaise, night sweats, fever, chills, weight los Neck: Negative for lumps, goiter, pain and significant neck swelling Resp: Negative for cough, wheezing, dyspnea at rest CV: Negative for chest pain, leg swelling, palpitations, orthopnea GI: denies melena, hematochezia, nausea, vomiting, dysphagia, odyonophagia, early satiety or unintentional weight loss. +mix of both constipation and diarrhea at times +burning/stinging lower abdominal pain MSK: Negative for joint pain or swelling, back pain, and muscle pain. Derm: Negative for itching or rash Psych: Denies depression, anxiety, memory loss, confusion. No homicidal or suicidal ideation.  Heme: Negative for prolonged bleeding, bruising easily, and swollen nodes. Endocrine: Negative for cold or heat  intolerance, polyuria, polydipsia and goiter. Neuro: negative for tremor, gait imbalance, syncope and seizures. The remainder of the review of systems is noncontributory.  Physical Exam: BP 136/83 (BP Location: Left Arm, Patient Position: Sitting, Cuff Size: Large)    Pulse 91    Temp 98 F (36.7 C) (Oral)    Ht 5\' 8"  (1.727 m)    Wt 179 lb 4.8 oz (81.3 kg)    BMI 27.26 kg/m  General:   Alert and oriented. No distress noted. Pleasant and cooperative.  Head:  Normocephalic and atraumatic. Eyes:  Conjuctiva clear without scleral icterus. Mouth:  Oral mucosa pink and moist. Good dentition. No lesions. Heart: Normal rate and rhythm, s1 and s2  heart sounds present.  Lungs: Clear lung sounds in all lobes. Respirations equal and unlabored. Abdomen:  +BS, soft, non-tender and non-distended. No rebound or guarding. No HSM or masses noted. Derm: No jaundice +palmar erythema Msk:  Symmetrical without gross deformities. Normal posture. Extremities:  Without edema. Neurologic:  Alert and  oriented x4 Psych:  Alert and cooperative. Normal mood and affect.  Invalid input(s): 6 MONTHS   ASSESSMENT: Linda Williamson is a 87 y.o. female presenting today as a new patient for abdominal pain and diarrhea.  Patient with no current GI complaints. She tells me that she typically has constipation which she drinks prune juice for which will then cause her to have diarrhea. She states that for the past month she has been having a solid BM everyday without issue. She has some lower abdominal, stinging/burning x1 month, though recently diagnosed with a UTI on 2/17, I suspect this is related to recent infection given the timing. No red flag symptoms. Patient denies melena, hematochezia, nausea, vomiting,  dysphagia, odyonophagia, early satiety or weight loss.   Patient has never had screening colonoscopy before, no family hx of colon cancer or colon polyps that patient is aware of, I did discuss indications for having a  screening colonoscopy given patient's relatively good health, however, patient does not wish to proceed with this at this time. She will make me aware if she changes her mind.  In regards to Palmar erythema, recent LFTs, Platelet count and INR are not concerning for obvious cirrhosis, last imaging in 2021 with hepatic steatosis. She has had no LE edema, ascites, confusion, pruritus or jaundice. I do not feel that this finding is related to liver disease. I discussed avoiding greasy, fried, fatty foods and 30 minutes of exercise 4-5x/week.   All questions were answered, patient verbalized understanding and is in agreement with plan as outline above.   PLAN:  Drink plenty of water, eat diet high in fruits, veggies, whole grains 2. Avoid greasy, fried, fatty foods 3. Get 30 minutes of exercise 4-5x/week 4. Consider screening colonoscopy 5. Can try miralax if constipation is not resolved with diet/prune juice 6. Pt to let me know if she has any new GI symptoms 7. Limit alcohol intake  Follow Up: PRN  Lilo Wallington L. Alver Sorrow, MSN, APRN, AGNP-C Adult-Gerontology Nurse Practitioner [facility]

## 2021-05-05 NOTE — Patient Instructions (Signed)
Please let me know if you decide you would like to proceed with screening colonoscopy as you have never had one before. As far as your constipation, it is important to drink plenty of water, eat a diet high in fruits, veggies and whole grains. If you are still struggling with constipation, you can try taking Miralax 1 capful every day for one week. If bowel movements do not improve, increase to 1 capful every 12 hours. If after two weeks there is no improvement, increase to 1 capful every 8 hours. As far as redness of your palms, this can be herditary or caused by liver disease, however, I do not see any indication in recent labs to indicate concern for liver disease, last imaging in 2021 showed some fatty liver so please be mindful of this and limit greasy, fatty, fried foods, try to exercise atleast 30 minutes per day, 4-5 days per week.  If you develop any worsening GI symptoms, please let me know.  Follow up as needed

## 2021-06-05 ENCOUNTER — Ambulatory Visit: Payer: PPO | Admitting: Urology

## 2021-06-05 ENCOUNTER — Encounter: Payer: Self-pay | Admitting: Urology

## 2021-06-05 VITALS — BP 137/83 | HR 116

## 2021-06-05 DIAGNOSIS — N3946 Mixed incontinence: Secondary | ICD-10-CM | POA: Diagnosis not present

## 2021-06-05 DIAGNOSIS — Z87440 Personal history of urinary (tract) infections: Secondary | ICD-10-CM | POA: Diagnosis not present

## 2021-06-05 LAB — URINALYSIS, ROUTINE W REFLEX MICROSCOPIC
Bilirubin, UA: NEGATIVE
Glucose, UA: NEGATIVE
Nitrite, UA: NEGATIVE
Protein,UA: NEGATIVE
Specific Gravity, UA: 1.02 (ref 1.005–1.030)
Urobilinogen, Ur: 0.2 mg/dL (ref 0.2–1.0)
pH, UA: 5.5 (ref 5.0–7.5)

## 2021-06-05 NOTE — Progress Notes (Signed)
? ?Assessment: ?1. Mixed incontinence   ?2. History of UTI   ? ? ?Plan: ?Continue Kegel exercises  ?Return to office prn ? ?Chief Complaint:  ?Chief Complaint  ?Patient presents with  ? Urinary Incontinence  ? ?  ?History of Present Illness: ? ?Linda Williamson is a 87 y.o. year old female who is seen for further evaluation of urinary incontinence.  At her initial visit on 10/3/122, she reported a history of stress incontinence for a number of years.  She recently had worsening urge incontinence, most noticeable after laughing.  She noted urgency with associated incontinence.  She was using at least 3 poise pads per day.  She reported nighttime incontinence and nocturia x3.  No dysuria or gross hematuria.  No recent UTIs.  No prior medical therapy for her symptoms.  No problems with constipation. ?She was given a trial of Myrbetriq 50 mg daily in 10/22. ?Urine cx: 10-25 K Staph.  Treated with Cipro x 3 days. ? ?At her return visit in 11/22, she had discontinued the Myrbetriq as she felt it was causing GI problems with diarrhea.  She had stopped the medication over a week prior to her visit but continued to have some diffuse abdominal discomfort after eating and irregular bowel movements.  She continued to experience incontinence.  She had completed the Cipro.  No dysuria or gross hematuria.  She reported a vaginal discharge and odor. ?Urine culture from 11/22 showed <10K colonies of mixed flora. ? ?At her visit in 1/23, she continued to have some intermittent vaginal bleeding.  She was evaluated by gynecology and started on vaginal hormone cream.  Review of her chart indicates findings on pelvic exam showing a suture in the vaginal area, felt to be related to her prior incontinence procedure.  She was not having significant symptoms of incontinence.  She denied any dysuria or gross hematuria. ?At her visit in 2/23, she continued on Gemtesa with improvement in her incontinence with the medication.  No side effects.   No dysuria or gross hematuria. ?Urine culture grew <10K colonies. ? ?She returns today for follow-up.  She is no longer taking the [place]  She is not having any problems with incontinence.  No dysuria or gross hematuria. ? ?Portions of the above documentation were copied from a prior visit for review purposes only. ? ?Past Medical History:  ?Past Medical History:  ?Diagnosis Date  ? Bilateral lower extremity edema   ? Bilateral lower extremity edema 04/2013  ? Cancer [facility])   ? Cataract   ? Hypertension   ? Thyroid disease   ? ? ?Past Surgical History:  ?Past Surgical History:  ?Procedure Laterality Date  ? ABDOMINAL HYSTERECTOMY    ? APPENDECTOMY    ? CATARACT EXTRACTION    ? CHOLECYSTECTOMY    ? THROAT SURGERY    ? ? ?Allergies:  ?Allergies  ?Allergen Reactions  ? Nitrofurantoin Hives  ? Keflex [Cephalexin]   ? Albuterol Palpitations  ? Penicillins Rash  ? ? ?Family History:  ?Family History  ?Problem Relation Age of Onset  ? Heart failure Mother   ? Cancer Father   ?     Bladder  ? COPD Brother   ? Heart disease Sister   ? COPD Brother   ? ? ?Social History:  ?Social History  ? ?Tobacco Use  ? Smoking status: Former  ?  Packs/day: 0.50  ?  Types: Cigarettes  ?  Quit date: 02/01/2019  ?  Years since quitting: 2.3  ?  Smokeless tobacco: Never  ?Vaping Use  ? Vaping Use: Never used  ?Substance Use Topics  ? Alcohol use: Yes  ?  Comment: glass of wine occasionally   ? Drug use: No  ? ? ?ROS: ?Constitutional:  Negative for fever, chills, weight loss ?CV: Negative for chest pain, previous MI, hypertension ?Respiratory:  Negative for shortness of breath, wheezing, sleep apnea, frequent cough ?GI:  Negative for nausea, vomiting, bloody stool, GERD ? ?Physical exam: ?BP 137/83   Pulse (!) 116  ?GENERAL APPEARANCE:  Well appearing, well developed, well nourished, NAD ?HEENT:  Atraumatic, normocephalic, oropharynx clear ?NECK:  Supple without lymphadenopathy or thyromegaly ?ABDOMEN:  Soft, non-tender, no  masses ?EXTREMITIES:  Moves all extremities well, without clubbing, cyanosis, or edema ?NEUROLOGIC:  Alert and oriented x 3, normal gait, CN II-XII grossly intact ?MENTAL STATUS:  appropriate ?BACK:  Non-tender to palpation, No CVAT ?SKIN:  Warm, dry, and intact ? ? ?Results: ?U/A dipstick 2+ blood, 3+ LE ? ?

## 2021-06-07 ENCOUNTER — Other Ambulatory Visit: Payer: Self-pay

## 2021-06-07 ENCOUNTER — Encounter (HOSPITAL_COMMUNITY): Payer: Self-pay | Admitting: Emergency Medicine

## 2021-06-07 ENCOUNTER — Emergency Department (HOSPITAL_COMMUNITY)
Admission: EM | Admit: 2021-06-07 | Discharge: 2021-06-07 | Disposition: A | Discharge status: Home / Self Care | Payer: PPO | Attending: Emergency Medicine | Admitting: Emergency Medicine

## 2021-06-07 ENCOUNTER — Emergency Department (HOSPITAL_COMMUNITY): Payer: PPO

## 2021-06-07 DIAGNOSIS — R1013 Epigastric pain: Secondary | ICD-10-CM | POA: Insufficient documentation

## 2021-06-07 DIAGNOSIS — R202 Paresthesia of skin: Secondary | ICD-10-CM | POA: Diagnosis not present

## 2021-06-07 DIAGNOSIS — R1011 Right upper quadrant pain: Secondary | ICD-10-CM | POA: Insufficient documentation

## 2021-06-07 DIAGNOSIS — R200 Anesthesia of skin: Secondary | ICD-10-CM | POA: Diagnosis not present

## 2021-06-07 DIAGNOSIS — K6389 Other specified diseases of intestine: Secondary | ICD-10-CM | POA: Diagnosis not present

## 2021-06-07 LAB — CBC WITH DIFFERENTIAL/PLATELET
Abs Immature Granulocytes: 0.03 10*3/uL (ref 0.00–0.07)
Basophils Absolute: 0.1 10*3/uL (ref 0.0–0.1)
Basophils Relative: 1 %
Eosinophils Absolute: 0.1 10*3/uL (ref 0.0–0.5)
Eosinophils Relative: 2 %
HCT: 38.5 % (ref 36.0–46.0)
Hemoglobin: 12.1 g/dL (ref 12.0–15.0)
Immature Granulocytes: 0 %
Lymphocytes Relative: 24 %
Lymphs Abs: 1.8 10*3/uL (ref 0.7–4.0)
MCH: 29.5 pg (ref 26.0–34.0)
MCHC: 31.4 g/dL (ref 30.0–36.0)
MCV: 93.9 fL (ref 80.0–100.0)
Monocytes Absolute: 0.7 10*3/uL (ref 0.1–1.0)
Monocytes Relative: 9 %
Neutro Abs: 4.8 10*3/uL (ref 1.7–7.7)
Neutrophils Relative %: 64 %
Platelets: 189 10*3/uL (ref 150–400)
RBC: 4.1 MIL/uL (ref 3.87–5.11)
RDW: 13.3 % (ref 11.5–15.5)
WBC: 7.4 10*3/uL (ref 4.0–10.5)
nRBC: 0 % (ref 0.0–0.2)

## 2021-06-07 LAB — COMPREHENSIVE METABOLIC PANEL
ALT: 48 U/L — ABNORMAL HIGH (ref 0–44)
AST: 56 U/L — ABNORMAL HIGH (ref 15–41)
Albumin: 4.1 g/dL (ref 3.5–5.0)
Alkaline Phosphatase: 49 U/L (ref 38–126)
Anion gap: 10 (ref 5–15)
BUN: 11 mg/dL (ref 8–23)
CO2: 25 mmol/L (ref 22–32)
Calcium: 9.2 mg/dL (ref 8.9–10.3)
Chloride: 104 mmol/L (ref 98–111)
Creatinine, Ser: 0.75 mg/dL (ref 0.44–1.00)
GFR, Estimated: >60 mL/min (ref >60)
Glucose, Bld: 110 mg/dL — ABNORMAL HIGH (ref 70–99)
Potassium: 3.5 mmol/L (ref 3.5–5.1)
Sodium: 139 mmol/L (ref 135–145)
Total Bilirubin: 1.3 mg/dL — ABNORMAL HIGH (ref 0.3–1.2)
Total Protein: 7.6 g/dL (ref 6.5–8.1)

## 2021-06-07 LAB — LIPASE, BLOOD: Lipase: 24 U/L (ref 11–51)

## 2021-06-07 NOTE — ED Triage Notes (Signed)
Patient c/o right rib cage pain and abd swelling that started on Friday after sneezing that is progressively getting worse. Per patient shortness of breath and difficulty with deep breath since. Denies any nausea, vomiting, or diarrhea.  ?

## 2021-06-07 NOTE — ED Provider Notes (Signed)
?[facility] ?Provider Note ? ? ?CSN: [id number] ?Arrival date & time: 06/07/21  0703 ? ?  ? ?History ? ?Chief Complaint  ?Patient presents with  ? Chest Pain  ? ? ?Linda Williamson is a 87 y.o. female. ? ?HPI ?Elderly female presents with concern of pain in the epigastrium and right upper quadrant following what sounds to been a sneeze that occurred about 2 days ago.  Since that time pain has been in the upper abdomen, not improved with a medicated patch.  No apparent dyspnea, no vomiting, though she notes that she has not been eating much since yesterday.  It is 7 AM. ?Seemingly she was in her usual state of health prior to this event.  With ongoing discomfort over the past 40 hours she presents for evaluation. ?  ? ?Home Medications ?Prior to Admission medications   ?Medication Sig Start Date End Date Taking? Authorizing Provider  ?amLODipine (NORVASC) 5 MG tablet Take 5 mg by mouth daily. 05/04/16   [provider]  ?Ascorbic Acid (VITAMIN C) 1000 MG tablet Take 1,000 mg by mouth daily.    [provider]  ?CALCIUM PO Take 1 tablet by mouth daily.    [provider]  ?clopidogrel (PLAVIX) 75 MG tablet Take 1 tablet (75 mg total) by mouth daily. 04/09/21   Noemi Chapel, MD  ?Cyanocobalamin (B-12 PO) Take 1 tablet by mouth daily.    [provider]  ?furosemide (LASIX) 20 MG tablet Take 20 mg by mouth daily. 10/16/20   [provider]  ?levothyroxine (SYNTHROID) 75 MCG tablet Take 1 tablet (75 mcg total) by mouth daily. 02/09/19 05/05/21  Heath Lark D, DO  ?LORazepam (ATIVAN) 0.5 MG tablet Take 0.25 mg by mouth at bedtime as needed for anxiety. 03/20/21   [provider]  ?VITAMIN E PO Take 1 tablet by mouth daily.    [provider]  ?zolpidem (AMBIEN) 5 MG tablet Take 5 mg by mouth at bedtime. 04/02/21   [provider]  ?   ? ?Allergies    ?Nitrofurantoin, Keflex [cephalexin], Albuterol, and Penicillins   ? ?Review of Systems    ?Review of Systems  ?Constitutional:   ?     Per HPI, otherwise negative  ?HENT:    ?     Per HPI, otherwise negative  ?Respiratory:    ?     Per HPI, otherwise negative  ?Cardiovascular:   ?     Per HPI, otherwise negative  ?Gastrointestinal:  Negative for vomiting.  ?Endocrine:  ?     Negative aside from HPI  ?Genitourinary:   ?     Neg aside from HPI   ?Musculoskeletal:   ?     Per HPI, otherwise negative  ?Skin: Negative.   ?Neurological:  Negative for syncope.  ? ?Physical Exam ?Updated Vital Signs ?BP (!) 143/67   Pulse 79   Temp 98.1 ?F (36.7 ?C) (Oral)   Resp 18   Ht '5\' 9"'$  (1.753 m)   Wt 79.8 kg   SpO2 95%   BMI 25.99 kg/m?  ?Physical Exam ?Vitals and nursing note reviewed.  ?Constitutional:   ?   General: She is not in acute distress. ?   Appearance: She is well-developed.  ?   Comments: Animated elderly female awake and alert moving all extremities spontaneously, speaking rapidly  ?HENT:  ?   Head: Normocephalic and atraumatic.  ?Eyes:  ?   Conjunctiva/sclera: Conjunctivae normal.  ?Cardiovascular:  ?  Rate and Rhythm: Normal rate and regular rhythm.  ?Pulmonary:  ?   Effort: Pulmonary effort is normal. No respiratory distress.  ?   Breath sounds: Normal breath sounds. No stridor.  ?Abdominal:  ?   General: There is no distension.  ? ? ?Skin: ?   General: Skin is warm and dry.  ?Neurological:  ?   Mental Status: She is alert and oriented to person, place, and time.  ?   Cranial Nerves: No cranial nerve deficit.  ?Psychiatric:     ?   Mood and Affect: Mood normal.  ?   Comments: Tangential historian  ? ? ?ED Results / Procedures / Treatments   ?Labs ?(all labs ordered are listed, but only abnormal results are displayed) ?Labs Reviewed  ?COMPREHENSIVE METABOLIC PANEL - Abnormal; Notable for the following components:  ?    Result Value  ? Glucose, Bld 110 (*)   ? AST 56 (*)   ? ALT 48 (*)   ? Total Bilirubin 1.3 (*)   ? All other components within normal limits  ?CBC WITH DIFFERENTIAL/PLATELET   ?LIPASE, BLOOD  ? ? ?EKG ?EKG Interpretation ? ?Date/Time:  Sunday June 07 2021 07:32:23 EDT ?Ventricular Rate:  82 ?PR Interval:  158 ?QRS Duration: 98 ?QT Interval:  385 ?QTC Calculation: 450 ?R Axis:   72 ?Text Interpretation: Sinus rhythm Baseline wander ST-t wave abnormality Abnormal ECG Confirmed by Carmin Muskrat [phone]) on 06/07/2021 7:40:59 AM ? ?Radiology ?DG Abdomen Acute W/Chest ? ?Result Date: 06/07/2021 ?CLINICAL DATA:  Epigastric and right upper quadrant pain. EXAM: DG ABDOMEN ACUTE WITH 1 VIEW CHEST COMPARISON:  07/10/2020 FINDINGS: The lungs are clear without focal pneumonia, edema, pneumothorax or pleural effusion. The cardiopericardial silhouette is within normal limits for size. Telemetry leads overlie the chest. Upright film shows no evidence for intraperitoneal free air. There is no evidence for gaseous bowel dilation to suggest obstruction. Air in stool are seen scattered along the length of a mildly distended colon. Bones are demineralized. Surgical clips right upper quadrant suggest prior cholecystectomy. IMPRESSION: Negative abdominal radiographs.  No acute cardiopulmonary disease. Electronically Signed   By: Misty Stanley M.D.   On: 06/07/2021 09:20   ? ?Procedures ?Procedures  ? ? ?Medications Ordered in ED ?Medications - No data to display ? ?ED Course/ Medical Decision Making/ A&P ?This patient with a Hx of multiple medical issues including hepatic steatosis presents to the ED for concern of upper abdominal pain, this involves an extensive number of treatment options, and is a complaint that carries with it a high risk of complications and morbidity.   ? ?The differential diagnosis includes musculoskeletal etiology with history of hepatic dysfunction worsening hepatobiliary dysfunction, pancreatitis, this is less likely though she does have some anorexia. ? ? ?Social Determinants of Health: ? ?Age ? ?Additional history obtained: ? ?Additional history and/or information obtained from  chart review, notable for multiple recent outpatient visits, including for hot flashes, steatosis, vertebral artery disease. ? ? ?After the initial evaluation, orders, including: Labs, x-ray were initiated. ? ? ?Patient placed on Cardiac and Pulse-Oximetry Monitors. ?The patient was maintained on a cardiac monitor.  The cardiac monitored showed an rhythm of 80 sinus normal ?The patient was also maintained on pulse oximetry. The readings were typically 100% room air normal ? ? ?On repeat evaluation of the patient improved ?9:54 AM ?Patient accompanied by 2 family members.  After obtaining consent we discussed all findings, including reassuring x-rays, labs consistent with known hepatic steatosis, patient remains  hemodynamically unremarkable, awake, alert, afebrile.  We reviewed the patient's abdominal pain after sneezing, and with cutaneous changes there are some suspicion for soft tissue disruption, rectus sheath hematoma is a consideration.  No peritonitis, no substantial anemia, low suspicion for hemoperitoneum or other acute intra peritoneal phenomena.  Patient amenable to discharge with outpatient follow-up, as are her family members. ?Lab Tests: ? ?I personally interpreted labs.  The pertinent results include: Reassuring labs, hepatic steatosis ? ?Imaging Studies ordered: ? ?I independently visualized and interpreted imaging which showed no acute findings on acute ab ?I agree with the radiologist interpretation ? ? ?Dispostion / Final MDM: ? ?After consideration of the diagnostic results and the patient's response to treatment, this adult female presenting with abdominal and lower chest wall pain after sneezing episode is awake, alert, speaking clearly, with no evidence for intrathoracic or intraperitoneal acute phenomena.  Some suspicion for soft tissue injury given the cutaneous findings, tenderness to palpation and history of precipitant.  No evidence of bacteremia, sepsis, patient discharged with ongoing  therapy. ? ?Final Clinical Impression(s) / ED Diagnoses ?Final diagnoses:  ?Epigastric pain  ? ?  ?Carmin Muskrat, MD ?06/07/21 [phone] ? ?

## 2021-06-07 NOTE — Discharge Instructions (Signed)
As discussed, your evaluation today has been largely reassuring.  But, it is important that you monitor your condition carefully, and do not hesitate to return to the ED if you develop new, or concerning changes in your condition. ? ?Otherwise, please follow-up with your physician for appropriate ongoing care. ? ?In addition to Tylenol, 650 mg, 3 times daily, please obtain and use medicated patches including the ingredients methyl salicylate and lidocaine ? ?

## 2021-06-10 DIAGNOSIS — K219 Gastro-esophageal reflux disease without esophagitis: Secondary | ICD-10-CM | POA: Diagnosis not present

## 2021-06-22 ENCOUNTER — Ambulatory Visit: Payer: PPO | Admitting: Urology

## 2021-06-22 ENCOUNTER — Encounter: Payer: Self-pay | Admitting: Urology

## 2021-06-22 VITALS — BP 130/75 | HR 101

## 2021-06-22 DIAGNOSIS — Z87440 Personal history of urinary (tract) infections: Secondary | ICD-10-CM | POA: Diagnosis not present

## 2021-06-22 DIAGNOSIS — R8290 Unspecified abnormal findings in urine: Secondary | ICD-10-CM

## 2021-06-22 DIAGNOSIS — N3946 Mixed incontinence: Secondary | ICD-10-CM | POA: Diagnosis not present

## 2021-06-22 DIAGNOSIS — M7541 Impingement syndrome of right shoulder: Secondary | ICD-10-CM | POA: Diagnosis not present

## 2021-06-22 MED ORDER — GEMTESA 75 MG PO TABS: 75.0000 mg | ORAL_TABLET | Freq: Every day | ORAL | 0 refills | Status: DC

## 2021-06-22 NOTE — Progress Notes (Signed)
? ?Assessment: ?1. Mixed incontinence   ?2. History of UTI   ? ? ?Plan: ?Continue Kegel exercises  ?Continue Gemtesa ?Urine culture sent today - will call with results ?Return to office in 3 months ?Recommend evaluation by PCP for shoulder pain ? ?Chief Complaint:  ?Chief Complaint  ?Patient presents with  ? Urinary Incontinence  ? ?  ?History of Present Illness: ? ?Linda Williamson is a 87 y.o. year old female who is seen for further evaluation of urinary incontinence.  At her initial visit on 10/3/122, she reported a history of stress incontinence for a number of years.  She recently had worsening urge incontinence, most noticeable after laughing.  She noted urgency with associated incontinence.  She was using at least 3 poise pads per day.  She reported nighttime incontinence and nocturia x3.  No dysuria or gross hematuria.  No recent UTIs.  No prior medical therapy for her symptoms.  No problems with constipation. ?She was given a trial of Myrbetriq 50 mg daily in 10/22. ?Urine cx: 10-25 K Staph.  Treated with Cipro x 3 days. ? ?At her return visit in 11/22, she had discontinued the Myrbetriq as she felt it was causing GI problems with diarrhea.  She had stopped the medication over a week prior to her visit but continued to have some diffuse abdominal discomfort after eating and irregular bowel movements.  She continued to experience incontinence.  She had completed the Cipro.  No dysuria or gross hematuria.  She reported a vaginal discharge and odor. ?Urine culture from 11/22 showed <10K colonies of mixed flora. ? ?At her visit in 1/23, she continued to have some intermittent vaginal bleeding.  She was evaluated by gynecology and started on vaginal hormone cream.  Review of her chart indicates findings on pelvic exam showing a suture in the vaginal area, felt to be related to her prior incontinence procedure.  She was not having significant symptoms of incontinence.  She denied any dysuria or gross  hematuria. ?At her visit in 2/23, she continued on Gemtesa with improvement in her incontinence with the medication.  No side effects.  No dysuria or gross hematuria. ?Urine culture grew <10K colonies. ? ?At her visit in March 2023, she was no longer taking the [place]  She was not having any problems with incontinence.  No dysuria or gross hematuria. ? ?She returns today for follow-up.  She was recently in the emergency room on 06/07/2021 for right shoulder and epigastric pain after sneezing.  Lab results showed a normal creatinine, and normal CBC.  She has resumed the [place].  She is not having any problems with incontinence.  No dysuria or gross hematuria. ? ?Portions of the above documentation were copied from a prior visit for review purposes only. ? ?Past Medical History:  ?Past Medical History:  ?Diagnosis Date  ? Bilateral lower extremity edema   ? Bilateral lower extremity edema 04/2013  ? Cancer [facility])   ? Cataract   ? Hypertension   ? Thyroid disease   ? ? ?Past Surgical History:  ?Past Surgical History:  ?Procedure Laterality Date  ? ABDOMINAL HYSTERECTOMY    ? APPENDECTOMY    ? CATARACT EXTRACTION    ? CHOLECYSTECTOMY    ? THROAT SURGERY    ? ? ?Allergies:  ?Allergies  ?Allergen Reactions  ? Nitrofurantoin Hives  ? Keflex [Cephalexin]   ? Albuterol Palpitations  ? Penicillins Rash  ? ? ?Family History:  ?Family History  ?Problem Relation Age of  Onset  ? Heart failure Mother   ? Cancer Father   ?     Bladder  ? COPD Brother   ? Heart disease Sister   ? COPD Brother   ? ? ?Social History:  ?Social History  ? ?Tobacco Use  ? Smoking status: Former  ?  Packs/day: 0.50  ?  Types: Cigarettes  ?  Quit date: 02/01/2019  ?  Years since quitting: 2.3  ? Smokeless tobacco: Never  ?Vaping Use  ? Vaping Use: Never used  ?Substance Use Topics  ? Alcohol use: Yes  ?  Comment: glass of wine occasionally   ? Drug use: No  ? ? ?ROS: ?Constitutional:  Negative for fever, chills, weight loss ?CV: Negative for chest pain,  previous MI, hypertension ?Respiratory:  Negative for shortness of breath, wheezing, sleep apnea, frequent cough ?GI:  Negative for nausea, vomiting, bloody stool, GERD ? ?Physical exam: ?BP 130/75   Pulse (!) 101  ?GENERAL APPEARANCE:  Well appearing, well developed, well nourished, NAD ?HEENT:  Atraumatic, normocephalic, oropharynx clear ?NECK:  Supple without lymphadenopathy or thyromegaly ?ABDOMEN:  Soft, non-tender, no masses ?EXTREMITIES:  Moves all extremities well, without clubbing, cyanosis, or edema ?NEUROLOGIC:  Alert and oriented x 3, normal gait, CN II-XII grossly intact ?MENTAL STATUS:  appropriate ?BACK:  Non-tender to palpation, No CVAT ?SKIN:  Warm, dry, and intact ? ? ?Results: ?U/A: >30 WBC, 3-10 RBC, mod bacteria, nitrite negative ? ?

## 2021-06-23 LAB — URINALYSIS, ROUTINE W REFLEX MICROSCOPIC
Bilirubin, UA: NEGATIVE
Glucose, UA: NEGATIVE
Nitrite, UA: NEGATIVE
Specific Gravity, UA: 1.025 (ref 1.005–1.030)
Urobilinogen, Ur: 0.2 mg/dL (ref 0.2–1.0)
pH, UA: 5.5 (ref 5.0–7.5)

## 2021-06-23 LAB — MICROSCOPIC EXAMINATION
Renal Epithel, UA: NONE SEEN /hpf
WBC, UA: >30 /hpf — AB (ref 0–5)

## 2021-06-24 LAB — URINE CULTURE

## 2021-06-25 ENCOUNTER — Telehealth: Payer: Self-pay

## 2021-06-25 NOTE — Telephone Encounter (Signed)
Patient called with no answer. Message left stating a mychart message will be sent. ?

## 2021-06-25 NOTE — Telephone Encounter (Signed)
-----   Message from Primus Bravo, MD sent at 06/25/2021  8:47 AM EDT ----- ?Please notify Mrs. Goldsborough that her urine culture did not show evidence of a UTI.  No treatment indicated at this time. ?

## 2021-06-29 DIAGNOSIS — G43C0 Periodic headache syndromes in child or adult, not intractable: Secondary | ICD-10-CM | POA: Diagnosis not present

## 2021-06-29 DIAGNOSIS — R202 Paresthesia of skin: Secondary | ICD-10-CM | POA: Diagnosis not present

## 2021-06-29 DIAGNOSIS — I10 Essential (primary) hypertension: Secondary | ICD-10-CM | POA: Diagnosis not present

## 2021-06-29 DIAGNOSIS — G459 Transient cerebral ischemic attack, unspecified: Secondary | ICD-10-CM | POA: Diagnosis not present

## 2021-06-30 DIAGNOSIS — I10 Essential (primary) hypertension: Secondary | ICD-10-CM | POA: Diagnosis not present

## 2021-06-30 DIAGNOSIS — M25511 Pain in right shoulder: Secondary | ICD-10-CM | POA: Diagnosis not present

## 2021-06-30 DIAGNOSIS — Z760 Encounter for issue of repeat prescription: Secondary | ICD-10-CM | POA: Diagnosis not present

## 2021-06-30 DIAGNOSIS — G8929 Other chronic pain: Secondary | ICD-10-CM | POA: Diagnosis not present

## 2021-07-20 DIAGNOSIS — M79674 Pain in right toe(s): Secondary | ICD-10-CM | POA: Diagnosis not present

## 2021-07-20 DIAGNOSIS — M79675 Pain in left toe(s): Secondary | ICD-10-CM | POA: Diagnosis not present

## 2021-07-20 DIAGNOSIS — L600 Ingrowing nail: Secondary | ICD-10-CM | POA: Diagnosis not present

## 2021-07-20 DIAGNOSIS — G629 Polyneuropathy, unspecified: Secondary | ICD-10-CM | POA: Diagnosis not present

## 2021-07-28 DIAGNOSIS — G4700 Insomnia, unspecified: Secondary | ICD-10-CM | POA: Diagnosis not present

## 2021-07-28 DIAGNOSIS — I10 Essential (primary) hypertension: Secondary | ICD-10-CM | POA: Diagnosis not present

## 2021-07-28 DIAGNOSIS — T148XXA Other injury of unspecified body region, initial encounter: Secondary | ICD-10-CM | POA: Diagnosis not present

## 2021-08-04 DIAGNOSIS — R2681 Unsteadiness on feet: Secondary | ICD-10-CM | POA: Diagnosis not present

## 2021-08-04 DIAGNOSIS — G5712 Meralgia paresthetica, left lower limb: Secondary | ICD-10-CM | POA: Diagnosis not present

## 2021-08-04 DIAGNOSIS — M130 Polyarthritis, unspecified: Secondary | ICD-10-CM | POA: Diagnosis not present

## 2021-08-04 DIAGNOSIS — G5603 Carpal tunnel syndrome, bilateral upper limbs: Secondary | ICD-10-CM | POA: Diagnosis not present

## 2021-09-09 ENCOUNTER — Encounter (HOSPITAL_COMMUNITY): Payer: Self-pay

## 2021-09-09 ENCOUNTER — Emergency Department (HOSPITAL_COMMUNITY)
Admission: EM | Admit: 2021-09-09 | Discharge: 2021-09-09 | Disposition: A | Discharge status: Home / Self Care | Payer: PPO | Attending: Emergency Medicine | Admitting: Emergency Medicine

## 2021-09-09 ENCOUNTER — Emergency Department (HOSPITAL_COMMUNITY): Payer: PPO

## 2021-09-09 ENCOUNTER — Other Ambulatory Visit: Payer: Self-pay

## 2021-09-09 DIAGNOSIS — I10 Essential (primary) hypertension: Secondary | ICD-10-CM | POA: Insufficient documentation

## 2021-09-09 DIAGNOSIS — R1084 Generalized abdominal pain: Secondary | ICD-10-CM | POA: Diagnosis present

## 2021-09-09 DIAGNOSIS — R197 Diarrhea, unspecified: Secondary | ICD-10-CM | POA: Insufficient documentation

## 2021-09-09 DIAGNOSIS — N3000 Acute cystitis without hematuria: Secondary | ICD-10-CM | POA: Diagnosis not present

## 2021-09-09 DIAGNOSIS — Z79899 Other long term (current) drug therapy: Secondary | ICD-10-CM | POA: Insufficient documentation

## 2021-09-09 DIAGNOSIS — Z7902 Long term (current) use of antithrombotics/antiplatelets: Secondary | ICD-10-CM | POA: Diagnosis not present

## 2021-09-09 DIAGNOSIS — R1031 Right lower quadrant pain: Secondary | ICD-10-CM | POA: Diagnosis not present

## 2021-09-09 DIAGNOSIS — M545 Low back pain, unspecified: Secondary | ICD-10-CM | POA: Diagnosis not present

## 2021-09-09 DIAGNOSIS — M8588 Other specified disorders of bone density and structure, other site: Secondary | ICD-10-CM | POA: Diagnosis not present

## 2021-09-09 LAB — LIPASE, BLOOD: Lipase: 21 U/L (ref 11–51)

## 2021-09-09 LAB — CBC WITH DIFFERENTIAL/PLATELET
Abs Immature Granulocytes: 0.02 10*3/uL (ref 0.00–0.07)
Basophils Absolute: 0.1 10*3/uL (ref 0.0–0.1)
Basophils Relative: 1 %
Eosinophils Absolute: 0.1 10*3/uL (ref 0.0–0.5)
Eosinophils Relative: 2 %
HCT: 37 % (ref 36.0–46.0)
Hemoglobin: 12 g/dL (ref 12.0–15.0)
Immature Granulocytes: 0 %
Lymphocytes Relative: 27 %
Lymphs Abs: 1.3 10*3/uL (ref 0.7–4.0)
MCH: 29.7 pg (ref 26.0–34.0)
MCHC: 32.4 g/dL (ref 30.0–36.0)
MCV: 91.6 fL (ref 80.0–100.0)
Monocytes Absolute: 0.4 10*3/uL (ref 0.1–1.0)
Monocytes Relative: 9 %
Neutro Abs: 2.9 10*3/uL (ref 1.7–7.7)
Neutrophils Relative %: 61 %
Platelets: 181 10*3/uL (ref 150–400)
RBC: 4.04 MIL/uL (ref 3.87–5.11)
RDW: 13.2 % (ref 11.5–15.5)
WBC: 4.8 10*3/uL (ref 4.0–10.5)
nRBC: 0 % (ref 0.0–0.2)

## 2021-09-09 LAB — COMPREHENSIVE METABOLIC PANEL
ALT: 34 U/L (ref 0–44)
AST: 42 U/L — ABNORMAL HIGH (ref 15–41)
Albumin: 3.9 g/dL (ref 3.5–5.0)
Alkaline Phosphatase: 50 U/L (ref 38–126)
Anion gap: 8 (ref 5–15)
BUN: 10 mg/dL (ref 8–23)
CO2: 27 mmol/L (ref 22–32)
Calcium: 9.5 mg/dL (ref 8.9–10.3)
Chloride: 105 mmol/L (ref 98–111)
Creatinine, Ser: 0.6 mg/dL (ref 0.44–1.00)
GFR, Estimated: >60 mL/min (ref >60)
Glucose, Bld: 104 mg/dL — ABNORMAL HIGH (ref 70–99)
Potassium: 4.2 mmol/L (ref 3.5–5.1)
Sodium: 140 mmol/L (ref 135–145)
Total Bilirubin: 0.9 mg/dL (ref 0.3–1.2)
Total Protein: 7.1 g/dL (ref 6.5–8.1)

## 2021-09-09 LAB — URINALYSIS, ROUTINE W REFLEX MICROSCOPIC
Bilirubin Urine: NEGATIVE
Glucose, UA: NEGATIVE mg/dL
Ketones, ur: NEGATIVE mg/dL
Nitrite: NEGATIVE
Protein, ur: NEGATIVE mg/dL
Specific Gravity, Urine: 1.01 (ref 1.005–1.030)
pH: 7 (ref 5.0–8.0)

## 2021-09-09 MED ORDER — SODIUM CHLORIDE 0.9 % IV BOLUS
500.0000 mL | Freq: Once | INTRAVENOUS | Status: AC
  Administered 2021-09-09: 500 mL via INTRAVENOUS

## 2021-09-09 MED ORDER — IOHEXOL 300 MG/ML  SOLN
100.0000 mL | Freq: Once | INTRAMUSCULAR | Status: AC | PRN
  Administered 2021-09-09: 100 mL via INTRAVENOUS

## 2021-09-09 MED ORDER — SULFAMETHOXAZOLE-TRIMETHOPRIM 800-160 MG PO TABS: 1.0000 | ORAL_TABLET | Freq: Two times a day (BID) | ORAL | 0 refills | Status: DC

## 2021-09-09 MED ORDER — SULFAMETHOXAZOLE-TRIMETHOPRIM 800-160 MG PO TABS: 1.0000 | ORAL_TABLET | Freq: Two times a day (BID) | ORAL | 0 refills | Status: AC

## 2021-09-09 MED ORDER — OXYCODONE-ACETAMINOPHEN 5-325 MG PO TABS: 1.0000 | ORAL_TABLET | Freq: Four times a day (QID) | ORAL | 0 refills | Status: DC | PRN

## 2021-09-09 NOTE — ED Provider Notes (Signed)
[facility] EMERGENCY DEPARTMENT Provider Note   CSN: [id number] Arrival date & time: 09/09/21  0636     History  Chief Complaint  Patient presents with   multiple complaints    Linda Williamson is a 87 y.o. female.  Patient complains of abdominal pain and some diarrhea.  She also has right shoulder and right hip pain for a number of years.  Patient states she had 1 episode of diarrhea, patient has a history of hypertension  The history is provided by the patient and medical records. No language interpreter was used.  Abdominal Pain Pain location:  Generalized Pain quality: aching   Pain radiates to:  Does not radiate Pain severity:  Moderate Onset quality:  Sudden Timing:  Intermittent Associated symptoms: no chest pain, no cough, no diarrhea, no fatigue and no hematuria        Home Medications Prior to Admission medications   Medication Sig Start Date End Date Taking? Authorizing Provider  amLODipine (NORVASC) 5 MG tablet Take 5 mg by mouth daily. 05/04/16  Yes [provider]  clopidogrel (PLAVIX) 75 MG tablet Take 1 tablet (75 mg total) by mouth daily. 04/09/21  Yes Noemi Chapel, MD  furosemide (LASIX) 20 MG tablet Take 20 mg by mouth daily. 10/16/20  Yes [provider]  levothyroxine (SYNTHROID) 75 MCG tablet Take 1 tablet (75 mcg total) by mouth daily. 02/09/19 09/09/21 Yes Shah, Pratik D, DO  oxyCODONE-acetaminophen (PERCOCET/ROXICET) 5-325 MG tablet Take 1 tablet by mouth every 6 (six) hours as needed for severe pain. 09/09/21  Yes Milton Ferguson, MD  zolpidem (AMBIEN) 5 MG tablet Take 5 mg by mouth at bedtime. 04/02/21  Yes [provider]  LORazepam (ATIVAN) 0.5 MG tablet Take 0.25 mg by mouth at bedtime as needed for anxiety. Patient not taking: Reported on 09/09/2021 03/20/21   [provider]  sulfamethoxazole-trimethoprim (BACTRIM DS) 800-160 MG tablet Take 1 tablet by mouth 2 (two) times daily for 7 days. 09/09/21 09/16/21  Milton Ferguson,  MD  Vibegron (GEMTESA) 75 MG TABS Take 75 mg by mouth daily. Patient not taking: Reported on 09/09/2021 06/22/21   Primus Bravo., MD      Allergies    Nitrofurantoin, Keflex [cephalexin], Albuterol, and Penicillins    Review of Systems   Review of Systems  Constitutional:  Negative for appetite change and fatigue.  HENT:  Negative for congestion, ear discharge and sinus pressure.   Eyes:  Negative for discharge.  Respiratory:  Negative for cough.   Cardiovascular:  Negative for chest pain.  Gastrointestinal:  Positive for abdominal pain. Negative for diarrhea.  Genitourinary:  Negative for frequency and hematuria.  Musculoskeletal:  Negative for back pain.  Skin:  Negative for rash.  Neurological:  Negative for seizures and headaches.  Psychiatric/Behavioral:  Negative for hallucinations.     Physical Exam Updated Vital Signs BP (!) 165/83   Pulse 77   Temp (!) 97.5 F (36.4 C) (Oral)   Resp 18   SpO2 96%  Physical Exam Vitals and nursing note reviewed.  Constitutional:      Appearance: She is well-developed.  HENT:     Head: Normocephalic.     Nose: Nose normal.  Eyes:     General: No scleral icterus.    Conjunctiva/sclera: Conjunctivae normal.  Neck:     Thyroid: No thyromegaly.  Cardiovascular:     Rate and Rhythm: Normal rate and regular rhythm.     Heart sounds: No murmur heard.  No friction rub. No gallop.  Pulmonary:     Breath sounds: No stridor. No wheezing or rales.  Chest:     Chest wall: No tenderness.  Abdominal:     General: There is no distension.     Tenderness: There is abdominal tenderness. There is no rebound.  Musculoskeletal:        General: Normal range of motion.     Cervical back: Neck supple.  Lymphadenopathy:     Cervical: No cervical adenopathy.  Skin:    Findings: No erythema or rash.  Neurological:     Mental Status: She is alert and oriented to person, place, and time.     Motor: No abnormal muscle tone.      Coordination: Coordination normal.  Psychiatric:        Behavior: Behavior normal.     ED Results / Procedures / Treatments   Labs (all labs ordered are listed, but only abnormal results are displayed) Labs Reviewed  COMPREHENSIVE METABOLIC PANEL - Abnormal; Notable for the following components:      Result Value   Glucose, Bld 104 (*)    AST 42 (*)    All other components within normal limits  URINALYSIS, ROUTINE W REFLEX MICROSCOPIC - Abnormal; Notable for the following components:   Color, Urine STRAW (*)    Hgb urine dipstick SMALL (*)    Leukocytes,Ua LARGE (*)    Bacteria, UA RARE (*)    All other components within normal limits  URINE CULTURE  CBC WITH DIFFERENTIAL/PLATELET  LIPASE, BLOOD    EKG None  Radiology CT L-SPINE NO CHARGE  Result Date: 09/09/2021 CLINICAL DATA:  Low back pain. EXAM: CT LUMBAR SPINE WITHOUT CONTRAST TECHNIQUE: Multidetector CT imaging of the lumbar spine was performed without intravenous contrast administration. Multiplanar CT image reconstructions were also generated. RADIATION DOSE REDUCTION: This exam was performed according to the departmental dose-optimization program which includes automated exposure control, adjustment of the mA and/or kV according to patient size and/or use of iterative reconstruction technique. COMPARISON:  None Available. FINDINGS: Segmentation: 5 non rib-bearing lumbar vertebral bodies. Alignment: Mild dextrocurvature. No substantial sagittal subluxation. Vertebrae: Osteopenia. Vertebral body heights are maintained. No evidence of acute fracture. Paraspinal and other soft tissues: See same day CT abdomen/pelvis for intra-abdominal and intrapelvic evaluation. Disc levels: Degenerative disc disease is greatest at L5-S1 where there is disc height loss, endplate spurring, vacuum disc phenomena. Otherwise, mild for age multilevel bony degenerative change. IMPRESSION: 1. No evidence of acute fracture or traumatic malalignment. 2.  L5-S1 degenerative disc disease. 3. Osteopenia. Electronically Signed   By: Margaretha Sheffield M.D.   On: 09/09/2021 09:59   CT ABDOMEN PELVIS W CONTRAST  Result Date: 09/09/2021 CLINICAL DATA:  87 year old female with abdominal pain. Right lower quadrant pain. EXAM: CT ABDOMEN AND PELVIS WITH CONTRAST TECHNIQUE: Multidetector CT imaging of the abdomen and pelvis was performed using the standard protocol following bolus administration of intravenous contrast. RADIATION DOSE REDUCTION: This exam was performed according to the departmental dose-optimization program which includes automated exposure control, adjustment of the mA and/or kV according to patient size and/or use of iterative reconstruction technique. CONTRAST:  154m OMNIPAQUE IOHEXOL 300 MG/ML  SOLN COMPARISON:  Noncontrast CT Abdomen and Pelvis 05/23/2006. FINDINGS: Lower chest: No cardiomegaly or pericardial effusion. Calcified coronary artery atherosclerosis. Small gastric hiatal hernia is new or increased since 2008. No pleural effusion. Negative lung bases. Hepatobiliary: Chronically absent gallbladder.  Negative liver. Pancreas: Some pancreatic atrophy since 2008.  No acute  finding. Spleen: Negative. Adrenals/Urinary Tract: Normal adrenal glands. Symmetric renal enhancement and contrast excretion. No nephrolithiasis or pararenal inflammation. Ureters appear symmetric and fairly decompressed to the bladder. Incidental pelvic phleboliths. Unremarkable bladder. Stomach/Bowel: Decompressed rectosigmoid colon. Mild sigmoid diverticulosis, no active inflammation. Redundant upstream large bowel although only mild retained stool. No evidence of appendicitis, the appendix is difficult to delineate from the terminal ileum but there is no inflammation. Negative TI. No dilated small bowel. Intra-abdominal stomach and duodenum appear negative. No free air, free fluid, or mesenteric inflammation identified. Vascular/Lymphatic: Extensive Aortoiliac calcified  atherosclerosis. Major arterial structures in the abdomen and pelvis remain patent. Portal venous system appears patent. No lymphadenopathy identified. Reproductive: Absent uterus.  Diminutive or absent ovaries. Other: No pelvic free fluid. Musculoskeletal: Osteopenia. No acute osseous abnormality identified. IMPRESSION: 1. No acute or inflammatory process identified in the abdomen or pelvis. 2. Aortic Atherosclerosis (ICD10-I70.0). Calcified coronary artery atherosclerosis. 3. A small gastric hiatal hernia is new or increased since 2008. Electronically Signed   By: Genevie Ann M.D.   On: 09/09/2021 09:04    Procedures Procedures    Medications Ordered in ED Medications  sodium chloride 0.9 % bolus 500 mL (0 mLs Intravenous Stopped 09/09/21 0830)  iohexol (OMNIPAQUE) 300 MG/ML solution 100 mL (100 mLs Intravenous Contrast Given 09/09/21 6073)    ED Course/ Medical Decision Making/ A&P                           Medical Decision Making Amount and/or Complexity of Data Reviewed Labs: ordered. Radiology: ordered.  Risk Prescription drug management.  This patient presents to the ED for concern of abdominal pain, this involves an extensive number of treatment options, and is a complaint that carries with it a high risk of complications and morbidity.  The differential diagnosis includes UTI appendicitis   Co morbidities that complicate the patient evaluation  Hypertension   Additional history obtained:  Additional history obtained from patient External records from outside source obtained and reviewed including hospital record   Lab Tests:  I Ordered, and personally interpreted labs.  The pertinent results include: CBC and chemistries unremarkable, UA shows 20-50 white cells   Imaging Studies ordered:  I ordered imaging studies including CT abdomen and L-spine I independently visualized and interpreted imaging which showed some degenerative disc disease I agree with the radiologist  interpretation   Cardiac Monitoring: / EKG:  The patient was maintained on a cardiac monitor.  I personally viewed and interpreted the cardiac monitored which showed an underlying rhythm of: Normal sinus rhythm   Consultations Obtained: No consult Problem List / ED Course / Critical interventions / Medication management  Hypertension abdominal pain I ordered medication including normal saline for dehydration Reevaluation of the patient after these medicines showed that the patient improved I have reviewed the patients home medicines and have made adjustments as needed   Social Determinants of Health:  None   Test / Admission - Considered:  No other test considered  Patient with urinary tract infection and chronic shoulder and hip pain.  She is started on Bactrim and given Percocet and will follow-up with her family doctor and orthopedic        Final Clinical Impression(s) / ED Diagnoses Final diagnoses:  Acute cystitis without hematuria    Rx / DC Orders ED Discharge Orders          Ordered    sulfamethoxazole-trimethoprim (BACTRIM DS) 800-160 MG tablet  2  times daily,   Status:  Discontinued        09/09/21 1013    sulfamethoxazole-trimethoprim (BACTRIM DS) 800-160 MG tablet  2 times daily        09/09/21 1015    oxyCODONE-acetaminophen (PERCOCET/ROXICET) 5-325 MG tablet  Every 6 hours PRN        09/09/21 1015              Milton Ferguson, MD 09/09/21 1700

## 2021-09-09 NOTE — Discharge Instructions (Signed)
Follow-up with Dr. Nevada Crane for your bladder infection within 1 to 2 weeks.  Follow-up with Dr.Cairns for your hip and shoulder

## 2021-09-09 NOTE — ED Triage Notes (Signed)
Pt arrived from home w c/o back hurting for 5 years, Right shoulder hurting for 6 years, right hip hurting for 4 years, and abdominal pain that started yesterday with x1 episode pf diarrhea.

## 2021-09-10 LAB — URINE CULTURE: Culture: <10000 — AB

## 2021-09-16 ENCOUNTER — Encounter: Payer: Self-pay | Admitting: Orthopedic Surgery

## 2021-09-16 ENCOUNTER — Ambulatory Visit (INDEPENDENT_AMBULATORY_CARE_PROVIDER_SITE_OTHER): Payer: PPO | Admitting: Orthopedic Surgery

## 2021-09-16 VITALS — Ht 68.0 in | Wt 175.0 lb

## 2021-09-16 DIAGNOSIS — M7062 Trochanteric bursitis, left hip: Secondary | ICD-10-CM | POA: Diagnosis not present

## 2021-09-16 NOTE — Progress Notes (Signed)
New Patient Visit  Assessment: Linda Williamson is a 87 y.o. female with the following: 1. Greater trochanteric bursitis, left  Plan: NYRA ANSPAUGH has pain in the lateral left hip, radiating distally towards her leg.  She has tried medicines, topical treatments, ice, exercises but has not yet tried an injection.  She is interested in proceeding.  I think this will help her pain.  This was completed in clinic today.  We can consider therapy for her back, which is not causing her as much discomfort as the lateral hip at this time.  She is also having right shoulder pain, and has done well with injections in the past.  If she wants to proceed with an injection for her right shoulder, she will contact the clinic to schedule follow-up appointment.  Procedure note injection - Left lateral hip   Verbal consent was obtained to inject the Left lateral hip.  Patient localized the pain. Timeout was completed to confirm the site of injection.  The skin was prepped with alcohol and ethyl chloride was sprayed at the injection site.  A 21-gauge needle was used to inject 40 mg of Depo-Medrol and 1% lidocaine (4 cc) into the Left lateral hip, directly over the localized tenderness using a direct lateral approach.  There were no complications. A sterile bandage was applied.   Follow-up: Return if symptoms worsen or fail to improve.  Subjective:  Chief Complaint  Patient presents with   Back Pain    Mid back pain radiating down legs to toes for 3 yrs.   Hip Pain    Lt hip    History of Present Illness: Linda Williamson is a 87 y.o. female who presents for evaluation of left lateral hip pain.  She states she has had pain in the lateral hip for several months.  It starts lateral, and radiates towards her knee.  No prior injury.  She has tried medicines, as well as topical treatments without improvement.  She also has some lower back pain, which radiates distally, but this is not as severe as the pain in the  lateral hip at this time.  She is also complaining of pain in the right shoulder, and has received injections in the past.   Review of Systems: No fevers or chills + numbness and tingling No chest pain No shortness of breath No bowel or bladder dysfunction No GI distress No headaches   Medical History:  Past Medical History:  Diagnosis Date   Bilateral lower extremity edema    Bilateral lower extremity edema 04/2013   Cancer ([place])    Cataract    Hypertension    Thyroid disease     Past Surgical History:  Procedure Laterality Date   ABDOMINAL HYSTERECTOMY     APPENDECTOMY     CATARACT EXTRACTION     CHOLECYSTECTOMY     THROAT SURGERY      Family History  Problem Relation Age of Onset   Heart failure Mother    Cancer Father        Bladder   COPD Brother    Heart disease Sister    COPD Brother    Social History   Tobacco Use   Smoking status: Former    Packs/day: 0.50    Types: Cigarettes    Quit date: 02/01/2019    Years since quitting: 2.6   Smokeless tobacco: Never  Vaping Use   Vaping Use: Never used  Substance Use Topics   Alcohol use: Yes  Comment: glass of wine occasionally    Drug use: No    Allergies  Allergen Reactions   Nitrofurantoin Hives   Keflex [Cephalexin]    Albuterol Palpitations   Penicillins Rash    Current Meds  Medication Sig   amLODipine (NORVASC) 5 MG tablet Take 5 mg by mouth daily.   clopidogrel (PLAVIX) 75 MG tablet Take 1 tablet (75 mg total) by mouth daily.   furosemide (LASIX) 20 MG tablet Take 20 mg by mouth daily.   LORazepam (ATIVAN) 0.5 MG tablet Take 0.25 mg by mouth at bedtime as needed for anxiety.   oxyCODONE-acetaminophen (PERCOCET/ROXICET) 5-325 MG tablet Take 1 tablet by mouth every 6 (six) hours as needed for severe pain.   sulfamethoxazole-trimethoprim (BACTRIM DS) 800-160 MG tablet Take 1 tablet by mouth 2 (two) times daily for 7 days.   Vibegron (GEMTESA) 75 MG TABS Take 75 mg by mouth daily.    zolpidem (AMBIEN) 5 MG tablet Take 5 mg by mouth at bedtime.   Current Facility-Administered Medications for the 09/16/21 encounter (Office Visit) with Mordecai Rasmussen, MD  Medication   triamcinolone acetonide (KENALOG) 10 MG/ML injection 10 mg    Objective: Ht '5\' 8"'$  (1.727 m)   Wt 175 lb (79.4 kg)   BMI 26.61 kg/m   Physical Exam:  General: Elderly female., Alert and oriented., and No acute distress. Gait: Left sided antalgic gait.  Left hip without deformity.  No swelling.  Tenderness to palpation directly over the greater trochanter.  She is able to maintain a straight leg raise.  No pain with straight leg raise.  Toes are warm and well-perfused.  Sensation is intact throughout the left lower leg.  IMAGING: No new imaging obtained today   New Medications:  No orders of the defined types were placed in this encounter.     Mordecai Rasmussen, MD  09/16/2021 2:37 PM

## 2021-09-16 NOTE — Patient Instructions (Signed)

## 2021-09-18 DIAGNOSIS — G4700 Insomnia, unspecified: Secondary | ICD-10-CM | POA: Diagnosis not present

## 2021-09-18 DIAGNOSIS — N3946 Mixed incontinence: Secondary | ICD-10-CM | POA: Diagnosis not present

## 2021-09-18 DIAGNOSIS — K219 Gastro-esophageal reflux disease without esophagitis: Secondary | ICD-10-CM | POA: Diagnosis not present

## 2021-09-18 DIAGNOSIS — E039 Hypothyroidism, unspecified: Secondary | ICD-10-CM | POA: Diagnosis not present

## 2021-09-18 DIAGNOSIS — Z0189 Encounter for other specified special examinations: Secondary | ICD-10-CM | POA: Diagnosis not present

## 2021-09-18 DIAGNOSIS — Z79899 Other long term (current) drug therapy: Secondary | ICD-10-CM | POA: Diagnosis not present

## 2021-09-18 DIAGNOSIS — E785 Hyperlipidemia, unspecified: Secondary | ICD-10-CM | POA: Diagnosis not present

## 2021-09-18 DIAGNOSIS — G629 Polyneuropathy, unspecified: Secondary | ICD-10-CM | POA: Diagnosis not present

## 2021-09-18 DIAGNOSIS — Z6825 Body mass index (BMI) 25.0-25.9, adult: Secondary | ICD-10-CM | POA: Diagnosis not present

## 2021-09-18 DIAGNOSIS — E663 Overweight: Secondary | ICD-10-CM | POA: Diagnosis not present

## 2021-09-18 DIAGNOSIS — I10 Essential (primary) hypertension: Secondary | ICD-10-CM | POA: Diagnosis not present

## 2021-09-18 DIAGNOSIS — M25511 Pain in right shoulder: Secondary | ICD-10-CM | POA: Diagnosis not present

## 2021-09-21 DIAGNOSIS — N390 Urinary tract infection, site not specified: Secondary | ICD-10-CM | POA: Diagnosis not present

## 2021-09-21 DIAGNOSIS — E039 Hypothyroidism, unspecified: Secondary | ICD-10-CM | POA: Diagnosis not present

## 2021-09-21 DIAGNOSIS — E785 Hyperlipidemia, unspecified: Secondary | ICD-10-CM | POA: Diagnosis not present

## 2021-09-21 DIAGNOSIS — I10 Essential (primary) hypertension: Secondary | ICD-10-CM | POA: Diagnosis not present

## 2021-09-21 DIAGNOSIS — G629 Polyneuropathy, unspecified: Secondary | ICD-10-CM | POA: Diagnosis not present

## 2021-09-21 DIAGNOSIS — R7301 Impaired fasting glucose: Secondary | ICD-10-CM | POA: Diagnosis not present

## 2021-09-24 ENCOUNTER — Ambulatory Visit: Payer: PPO | Admitting: Urology

## 2021-09-29 DIAGNOSIS — E559 Vitamin D deficiency, unspecified: Secondary | ICD-10-CM | POA: Diagnosis not present

## 2021-09-29 DIAGNOSIS — E785 Hyperlipidemia, unspecified: Secondary | ICD-10-CM | POA: Diagnosis not present

## 2021-09-29 DIAGNOSIS — Z79899 Other long term (current) drug therapy: Secondary | ICD-10-CM | POA: Diagnosis not present

## 2021-09-29 DIAGNOSIS — I10 Essential (primary) hypertension: Secondary | ICD-10-CM | POA: Diagnosis not present

## 2021-09-29 DIAGNOSIS — E039 Hypothyroidism, unspecified: Secondary | ICD-10-CM | POA: Diagnosis not present

## 2021-09-29 DIAGNOSIS — E663 Overweight: Secondary | ICD-10-CM | POA: Diagnosis not present

## 2021-09-29 DIAGNOSIS — K219 Gastro-esophageal reflux disease without esophagitis: Secondary | ICD-10-CM | POA: Diagnosis not present

## 2021-09-29 DIAGNOSIS — R809 Proteinuria, unspecified: Secondary | ICD-10-CM | POA: Diagnosis not present

## 2021-09-29 DIAGNOSIS — G629 Polyneuropathy, unspecified: Secondary | ICD-10-CM | POA: Diagnosis not present

## 2021-09-29 DIAGNOSIS — N3946 Mixed incontinence: Secondary | ICD-10-CM | POA: Diagnosis not present

## 2021-09-29 DIAGNOSIS — G4700 Insomnia, unspecified: Secondary | ICD-10-CM | POA: Diagnosis not present

## 2021-09-29 DIAGNOSIS — M25511 Pain in right shoulder: Secondary | ICD-10-CM | POA: Diagnosis not present

## 2021-09-30 ENCOUNTER — Ambulatory Visit (INDEPENDENT_AMBULATORY_CARE_PROVIDER_SITE_OTHER): Payer: PPO | Admitting: Orthopedic Surgery

## 2021-09-30 ENCOUNTER — Encounter: Payer: Self-pay | Admitting: Orthopedic Surgery

## 2021-09-30 VITALS — Ht 68.0 in | Wt 175.0 lb

## 2021-09-30 DIAGNOSIS — M7581 Other shoulder lesions, right shoulder: Secondary | ICD-10-CM | POA: Diagnosis not present

## 2021-10-01 ENCOUNTER — Encounter: Payer: Self-pay | Admitting: Orthopedic Surgery

## 2021-10-01 NOTE — Progress Notes (Signed)
Orthopaedic Clinic Return  Assessment: Linda Williamson is a 87 y.o. female with the following: Right shoulder rotator cuff tendinitis  Plan: [name] has right shoulder pain, consistent with irritation of the rotator cuff tendons.  Her range of motion is pretty good.  She has had injections in her shoulder, which have been effective.  She would like to proceed.  This was completed today without issues.  Follow-up as needed.  Procedure note injection - Right shoulder    Verbal consent was obtained to inject the right shoulder, subacromial space Timeout was completed to confirm the site of injection.   The skin was prepped with alcohol and ethyl chloride was sprayed at the injection site.  A 21-gauge needle was used to inject 40 mg of Depo-Medrol and 1% lidocaine (3 cc) into the subacromial space of the right shoulder using a posterolateral approach.  There were no complications.  A sterile bandage was applied.    Follow-up: Return if symptoms worsen or fail to improve.   Subjective:  Chief Complaint  Patient presents with   Shoulder Pain    Rt shoulder    History of Present Illness: Linda Williamson is a 86 y.o. female who returns to clinic for evaluation of right shoulder pain.  I saw her recently, and she received an injection in the left lateral hip.  She tolerated this well.  She has had progressively worsening right shoulder pain.  She has had injections in the past.  No specific injury.  Her motion remains pretty good.  She does continue to complain about right-sided posterior thorax pain.  Review of Systems: No fevers or chills No numbness or tingling No chest pain No shortness of breath No bowel or bladder dysfunction No GI distress No headaches   Objective: Ht '5\' 8"'$  (1.727 m)   Wt 175 lb (79.4 kg)   BMI 26.61 kg/m   Physical Exam:  Evaluation of right shoulder demonstrates no deformity.  No atrophy is appreciated.  Tenderness to palpation over the anterior  and lateral aspect of the shoulder.  Slightly restricted forward elevation.  Pain with internal rotation of the lumbar spine.  4+/5 strength supraspinatus, infraspinatus strength testing.  Negative belly press.  IMAGING: I personally ordered and reviewed the following images:  No new imaging obtained today  Mordecai Rasmussen, MD 10/01/2021 10:52 PM

## 2021-10-01 NOTE — Patient Instructions (Signed)

## 2021-10-26 DIAGNOSIS — K5900 Constipation, unspecified: Secondary | ICD-10-CM | POA: Diagnosis not present

## 2021-10-26 DIAGNOSIS — R600 Localized edema: Secondary | ICD-10-CM | POA: Diagnosis not present

## 2021-11-02 ENCOUNTER — Ambulatory Visit: Payer: PPO | Admitting: Orthopedic Surgery

## 2021-11-03 ENCOUNTER — Telehealth: Payer: Self-pay | Admitting: Orthopedic Surgery

## 2021-11-03 NOTE — Telephone Encounter (Signed)
Returned patient's call per voice message left this afternoon; asking if she may have Prednisone prescribed, due to having to wait the full 3 months for another injection. Patient uses [place] if approved.

## 2021-11-05 MED ORDER — PREDNISONE 10 MG (21) PO TBPK: ORAL_TABLET | ORAL | 0 refills | Status: DC

## 2021-11-10 ENCOUNTER — Ambulatory Visit (HOSPITAL_COMMUNITY)
Admission: RE | Admit: 2021-11-10 | Discharge: 2021-11-10 | Disposition: A | Discharge status: Home / Self Care | Payer: PPO | Source: Ambulatory Visit | Attending: Family Medicine | Admitting: Family Medicine

## 2021-11-10 ENCOUNTER — Other Ambulatory Visit (HOSPITAL_COMMUNITY): Payer: Self-pay | Admitting: Family Medicine

## 2021-11-10 DIAGNOSIS — R072 Precordial pain: Secondary | ICD-10-CM | POA: Insufficient documentation

## 2021-11-10 DIAGNOSIS — I700 Atherosclerosis of aorta: Secondary | ICD-10-CM | POA: Diagnosis not present

## 2021-12-14 ENCOUNTER — Encounter: Payer: Self-pay | Admitting: Adult Health

## 2021-12-14 ENCOUNTER — Ambulatory Visit: Payer: PPO | Admitting: Adult Health

## 2021-12-14 VITALS — BP 145/65 | HR 69 | Ht 66.25 in | Wt 176.0 lb

## 2021-12-14 DIAGNOSIS — Z90710 Acquired absence of both cervix and uterus: Secondary | ICD-10-CM | POA: Insufficient documentation

## 2021-12-14 DIAGNOSIS — N939 Abnormal uterine and vaginal bleeding, unspecified: Secondary | ICD-10-CM | POA: Diagnosis not present

## 2021-12-14 DIAGNOSIS — M130 Polyarthritis, unspecified: Secondary | ICD-10-CM | POA: Diagnosis not present

## 2021-12-14 DIAGNOSIS — G5712 Meralgia paresthetica, left lower limb: Secondary | ICD-10-CM | POA: Diagnosis not present

## 2021-12-14 DIAGNOSIS — N952 Postmenopausal atrophic vaginitis: Secondary | ICD-10-CM | POA: Insufficient documentation

## 2021-12-14 DIAGNOSIS — R2681 Unsteadiness on feet: Secondary | ICD-10-CM | POA: Diagnosis not present

## 2021-12-14 DIAGNOSIS — G5603 Carpal tunnel syndrome, bilateral upper limbs: Secondary | ICD-10-CM | POA: Diagnosis not present

## 2021-12-14 MED ORDER — ESTRADIOL 0.1 MG/GM VA CREA: TOPICAL_CREAM | VAGINAL | 1 refills | Status: DC

## 2021-12-14 NOTE — Progress Notes (Signed)
  Subjective:     Patient ID: Linda Williamson, female   DOB: [birth year], 87 y.o.   MRN: [id number]  HPI Linda Williamson is a 87 year old white female,widowed, sp hysterectomy, in complaining of vaginal spotting at times, no pain.  PCP is Dr Nevada Crane.  Review of Systems   +vaginal spotting Denies any pain  Reviewed past medical,surgical, social and family history. Reviewed medications and allergies.  Objective:   Physical Exam BP (!) 145/65 (BP Location: Left Arm, Patient Position: Sitting, Cuff Size: Normal)   Pulse 69   Ht 5' 6.25" (1.683 m)   Wt 176 lb (79.8 kg)   BMI 28.19 kg/m     Skin warm and dry.Pelvic: external genitalia is normal in appearance no lesions, vagina is pale with loss of moisture and rugae, no bleeding noted, no lesions at cuff, can feel stitch at mid urethral sling,,urethra has no lesions or masses noted, cervix and uterus are absent,adnexa: no masses or tenderness noted. Bladder is non tender and no masses felt.  Fall risk is low  Upstream - 12/14/21 0901       Pregnancy Intention Screening   Does the patient want to become pregnant in the next year? N/A    Does the patient's partner want to become pregnant in the next year? N/A    Would the patient like to discuss contraceptive options today? N/A      Contraception Wrap Up   Current Method Female Sterilization   hyst   End Method Female Sterilization   hyst   Contraception Counseling Provided No             Assessment:     1. Vaginal atrophy +atrophy Will rx estrace cream Meds ordered this encounter  Medications   estradiol (ESTRACE) 0.1 MG/GM vaginal cream    Sig: Use 0.5 gm in vaginal 2-3 x weekly    Dispense:  42.5 g    Refill:  1    Order Specific Question:   Supervising Provider    Answer:   Tania Ade H [[id number]]    Follow up in 8 weeks for recheck.  2. Vaginal spotting None today but except related to atrophy Sees on panties Denies any pain associated with it  3. S/P hysterectomy     Plan:     Follow up in 8 weeks or sooner if needed

## 2021-12-30 DIAGNOSIS — R072 Precordial pain: Secondary | ICD-10-CM | POA: Diagnosis not present

## 2021-12-30 DIAGNOSIS — M79674 Pain in right toe(s): Secondary | ICD-10-CM | POA: Diagnosis not present

## 2021-12-30 DIAGNOSIS — G4700 Insomnia, unspecified: Secondary | ICD-10-CM | POA: Diagnosis not present

## 2021-12-30 DIAGNOSIS — I700 Atherosclerosis of aorta: Secondary | ICD-10-CM | POA: Diagnosis not present

## 2022-01-06 ENCOUNTER — Ambulatory Visit: Payer: PPO | Admitting: Orthopedic Surgery

## 2022-02-04 DIAGNOSIS — J069 Acute upper respiratory infection, unspecified: Secondary | ICD-10-CM | POA: Diagnosis not present

## 2022-02-08 ENCOUNTER — Encounter: Payer: Self-pay | Admitting: Adult Health

## 2022-02-08 ENCOUNTER — Ambulatory Visit (INDEPENDENT_AMBULATORY_CARE_PROVIDER_SITE_OTHER): Payer: PPO | Admitting: Adult Health

## 2022-02-08 VITALS — BP 130/72 | HR 83 | Ht 66.25 in | Wt 165.0 lb

## 2022-02-08 DIAGNOSIS — N952 Postmenopausal atrophic vaginitis: Secondary | ICD-10-CM | POA: Diagnosis not present

## 2022-02-08 NOTE — Progress Notes (Signed)
  Subjective:     Patient ID: Linda Williamson, female   DOB: 1935/01/24, 87 y.o.   MRN: [id number]  HPI Linda Williamson is a 87 year old white female,widowed, sp hysterectomy back in follow up on using estrace vaginal cream and feels better, she declines any exam. She is on prednisone from PCP.  PCP is Dr Nevada Crane.  Review of Systems Feels better in vagina area Reviewed past medical,surgical, social and family history. Reviewed medications and allergies.     Objective:   Physical Exam BP 130/72 (BP Location: Right Arm, Patient Position: Sitting, Cuff Size: Normal)   Pulse 83   Ht 5' 6.25" (1.683 m)   Wt 165 lb (74.8 kg)   BMI 26.43 kg/m     Skin warm and dry. Lungs: clear to ausculation bilaterally. Cardiovascular: regular rate and rhythm.  Pelvic deferred at her request.  Assessment:     1. Vaginal atrophy Continue estrace vaginal cream 2-3 x weekly Has refills    Plan:     Follow up prn

## 2022-03-03 IMAGING — MR MR HEAD W/O CM
11 of 12 series · 42 of 48 positions shown · non-contrast
Comparison: CT head same day.

CLINICAL DATA: Neuro deficit, acute, stroke suspected Left lower
extremity numbness without pain. Head CT negative

EXAM:
MRI HEAD WITHOUT CONTRAST
TECHNIQUE: Multiplanar, multiecho pulse sequences of the brain and surrounding
structures were obtained without intravenous contrast.

[Series 5: DWI · axial · 4.0mm · 0.88mm/px · z∈[-40,+99]mm · 4 of 36 slices shown (1 of 6)]
[im 1/36]
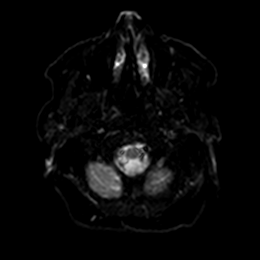
[im 12/36]
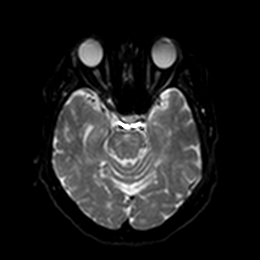
[im 24/36]
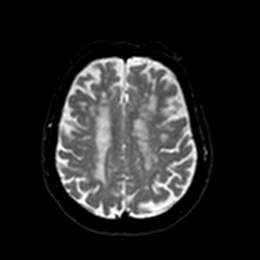
[im 36/36]
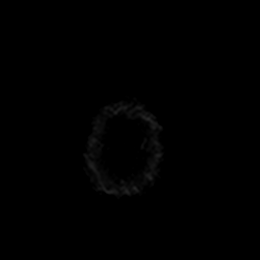

[Series 5: DWI · axial · 4.0mm · 0.88mm/px · z∈[-40,+99]mm · 4 of 36 slices shown (2 of 6)]
[im 1/36]
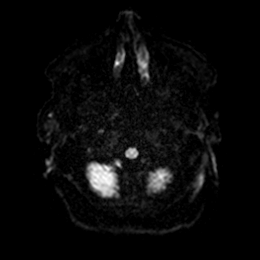
[im 12/36]
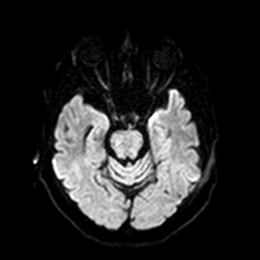
[im 24/36]
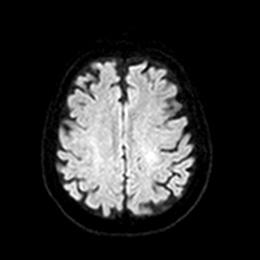
[im 36/36]
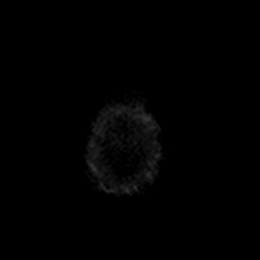

[Series 6: DWI · axial · 4.0mm · 0.88mm/px · z∈[-40,+99]mm · 4 of 36 slices shown (3 of 6)]
[im 1/36]
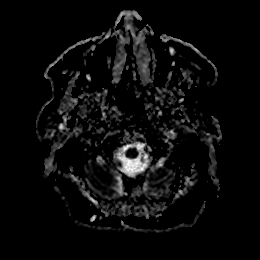
[im 12/36]
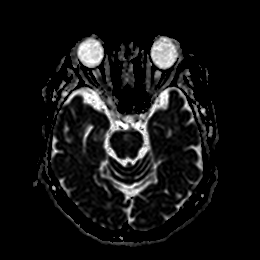
[im 24/36]
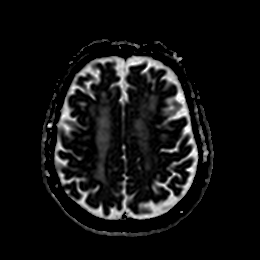
[im 36/36]
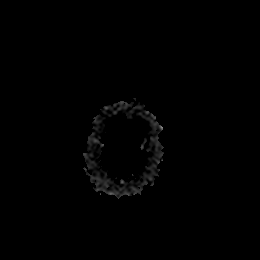

[Series 7: DWI · coronal · 5.0mm · 0.88mm/px · 4 of 29 slices shown (4 of 6)]
[im 1/29]
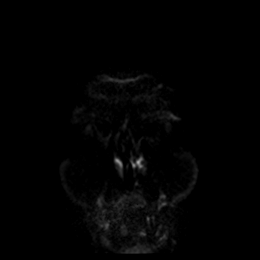
[im 10/29]
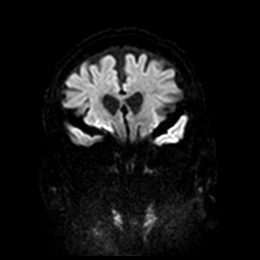
[im 19/29]
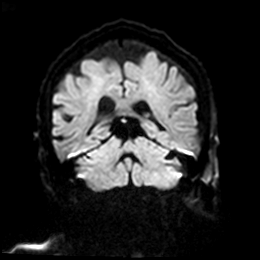
[im 29/29]
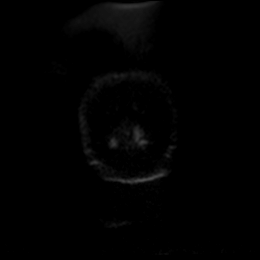

[Series 7: DWI · coronal · 5.0mm · 0.88mm/px · 4 of 29 slices shown (5 of 6)]
[im 1/29]
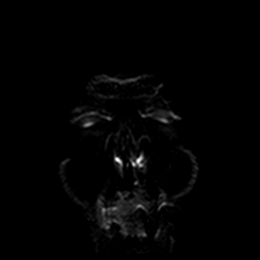
[im 10/29]
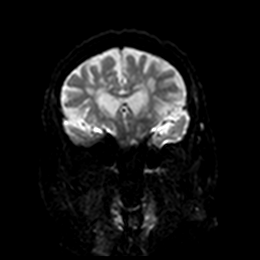
[im 19/29]
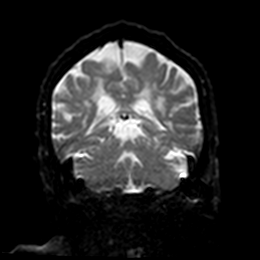
[im 29/29]
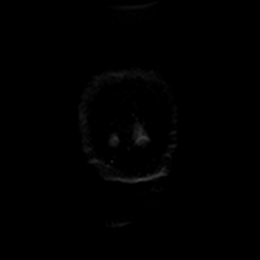

[Series 8: DWI · coronal · 5.0mm · 0.88mm/px · 4 of 29 slices shown (6 of 6)]
[im 1/29]
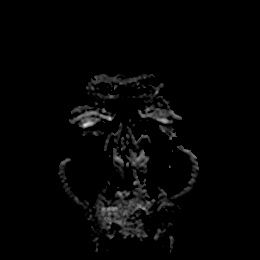
[im 10/29]
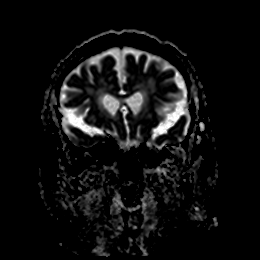
[im 19/29]
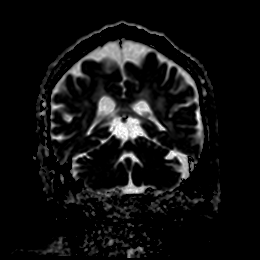
[im 29/29]
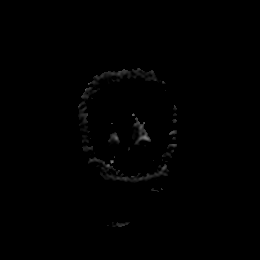

[Series 9: T1 · sagittal · 5.0mm · 0.94mm/px · 3 of 21 slices shown]
[im 1/21]
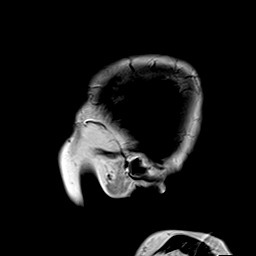
[im 11/21]
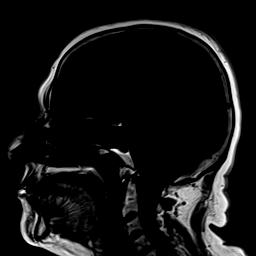
[im 21/21]
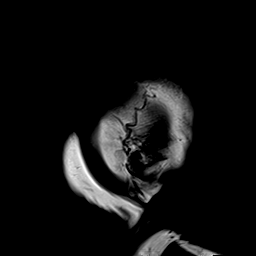

[Series 10: T2 · axial · 5.0mm · 0.72mm/px · z∈[-37,+96]mm · 3 of 20 slices shown (1 of 2)]
[im 1/20]
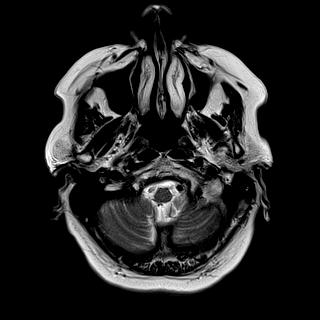
[im 10/20]
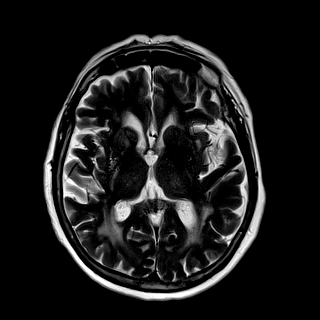
[im 20/20]
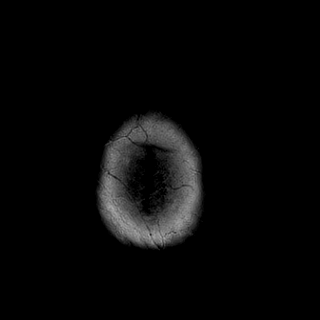

[Series 11: ax hemo · axial · 5.0mm · 0.86mm/px · z∈[-42,+101]mm · 3 of 25 slices shown]
[im 1/25]
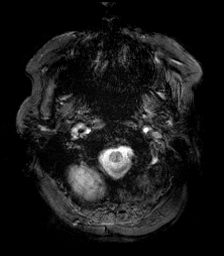
[im 13/25]
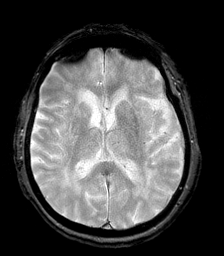
[im 25/25]
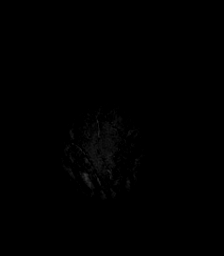

[Series 12: FLAIR · axial · 4.0mm · 0.43mm/px · z∈[-40,+99]mm · 5 of 36 slices shown]
[im 1/36]
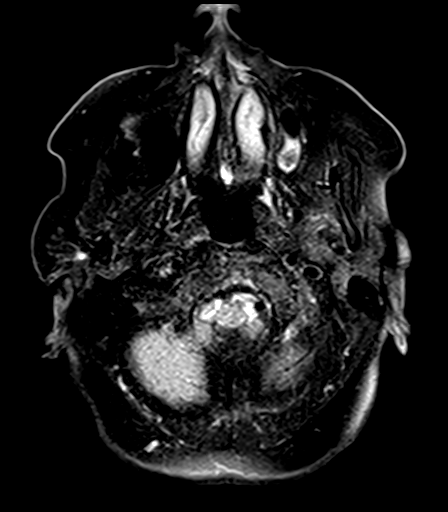
[im 9/36]
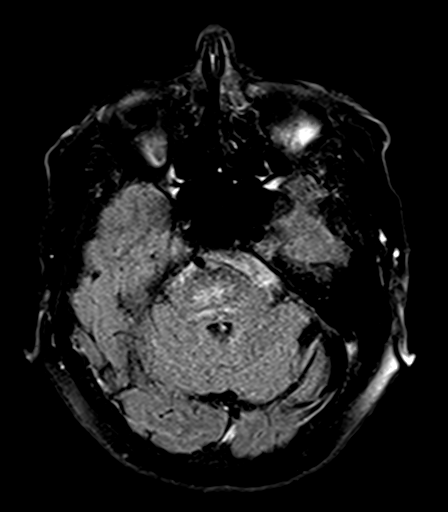
[im 18/36]
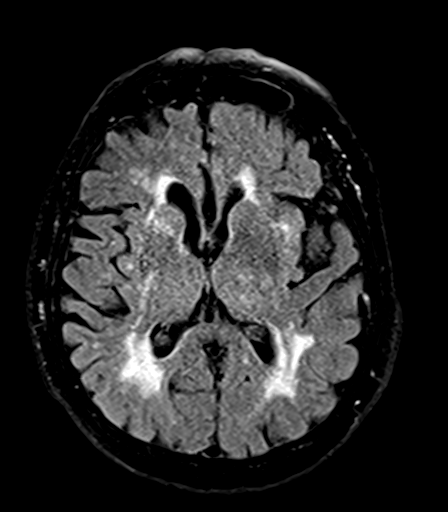
[im 27/36]
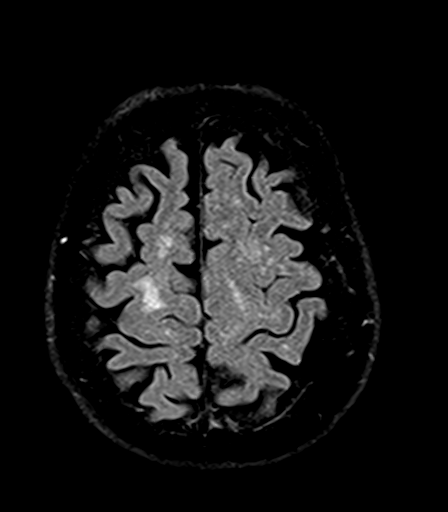
[im 36/36]
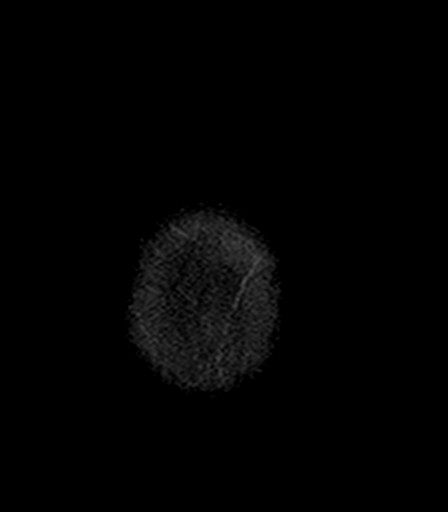

[Series 14: T2 · coronal · 5.0mm · 0.72mm/px · 4 of 28 slices shown (2 of 2)]
[im 1/28]
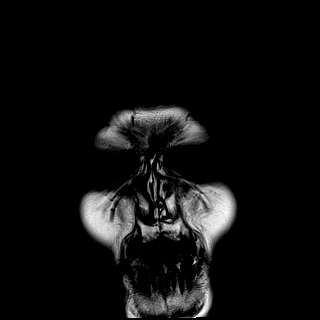
[im 10/28]
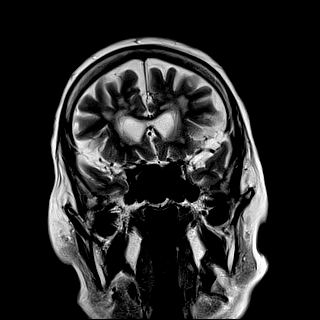
[im 19/28]
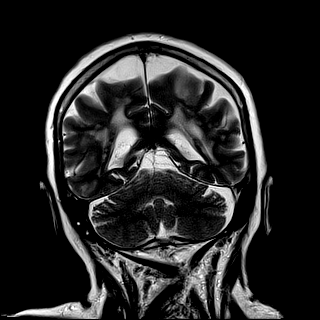
[im 28/28]
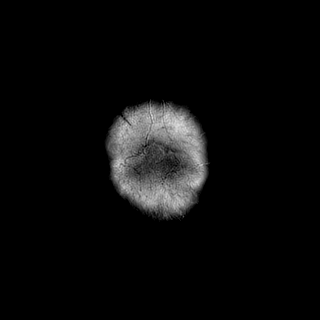

[42 of 48 positions shown; findings below may reference images not displayed]

FINDINGS: Brain: No acute infarction, hemorrhage, hydrocephalus, extra-axial
collection or mass lesion. Advanced confluent and patchy T2/FLAIR
hyperintensity within the supratentorial and pontine white matter,
nonspecific compatible with chronic microvascular disease.

Vascular: Abnormal signal within the right intradural vertebral
artery. Otherwise, major arterial flow voids are maintained.

Skull and upper cervical spine: Normal marrow signal.

Sinuses/Orbits: Largely clear sinuses.  Unremarkable orbits.

Other: No mastoid effusions.
IMPRESSION: 1. No evidence of acute intracranial abnormality.
2. Abnormal signal within the right intradural vertebral artery,
which could relate to underlying stenosis, occlusion or artifact. A
CTA could further evaluate if clinically indicated.
3. Advanced chronic microvascular ischemic disease.

## 2022-03-03 IMAGING — CT CT ANGIO HEAD-NECK (W OR W/O PERF)
2 of 7 series · 8 of 33 positions shown · IV contrast (Omnipaque or Isovue)
Comparison: Same day MRI.

CLINICAL DATA: Vertebral artery aneurysm suspected Vertebral artery
dissection suspected

EXAM:
CT ANGIOGRAPHY HEAD AND NECK
TECHNIQUE: Multidetector CT imaging of the head and neck was performed using
the standard protocol during bolus administration of intravenous
contrast. Multiplanar CT image reconstructions and MIPs were
obtained to evaluate the vascular anatomy. Carotid stenosis
measurements (when applicable) are obtained utilizing NASCET
criteria, using the distal internal carotid diameter as the
denominator.

[Series 4: cta head & neck · axial · 0.58mm/px · z∈[+1309,+1421]mm · 2 of 168 slices shown]
[im 56/168  soft-tissue]
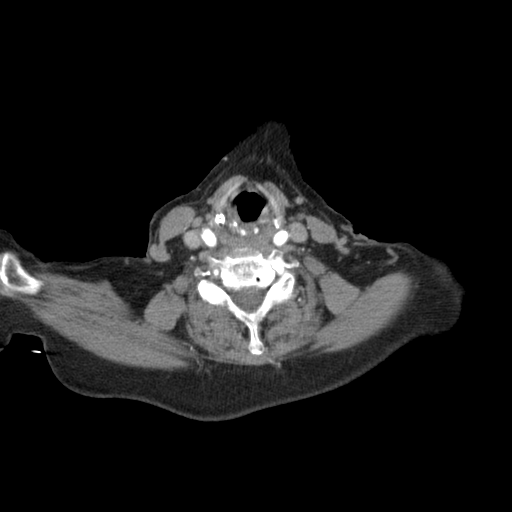
[im 112/168  soft-tissue]
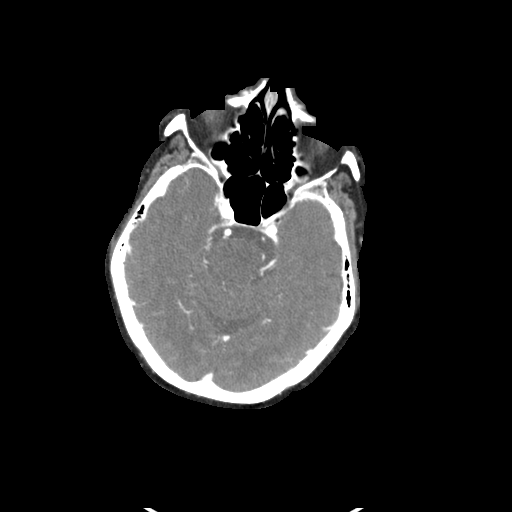

[Series 6: ax thins · axial · 0.42mm/px · z∈[+1219,+1470]mm · 6 of 355 slices shown]
[im 51/355  soft-tissue]
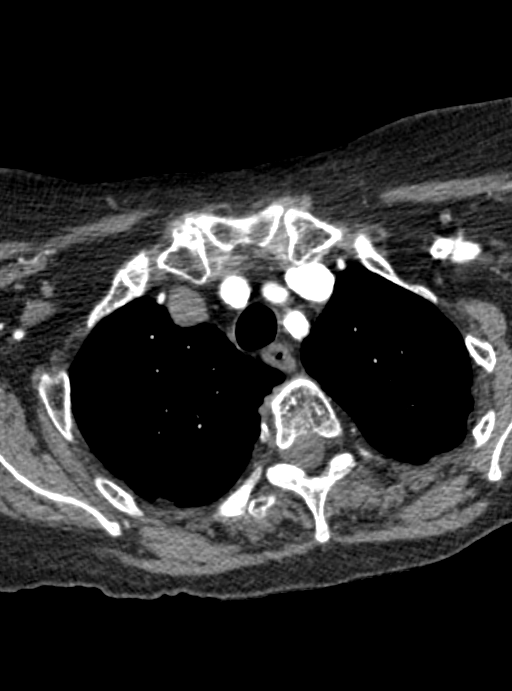
[im 102/355  bone]
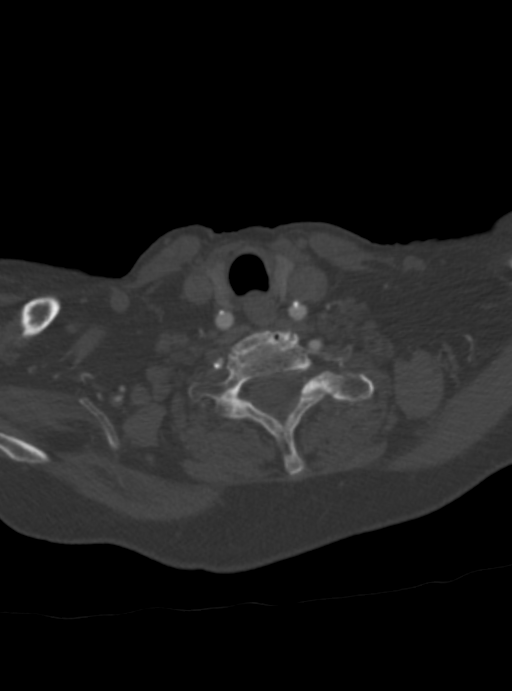
[im 152/355  soft-tissue]
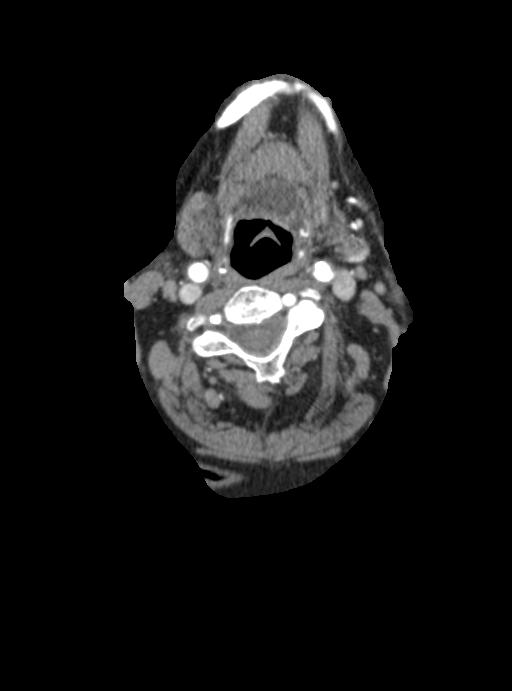
[im 203/355  bone]
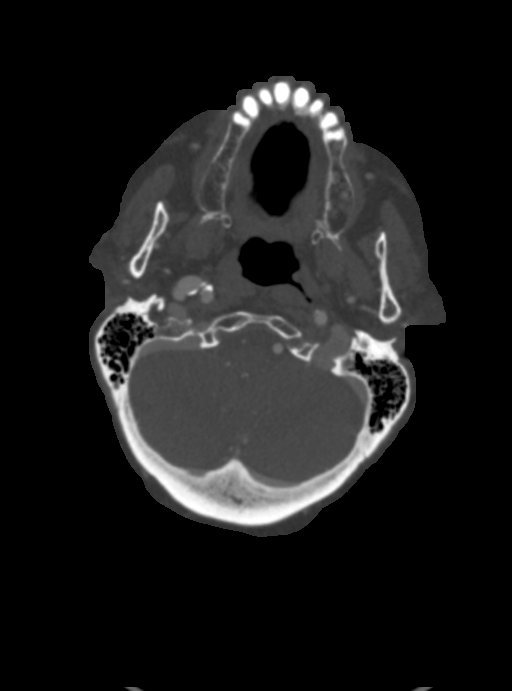
[im 253/355  soft-tissue]
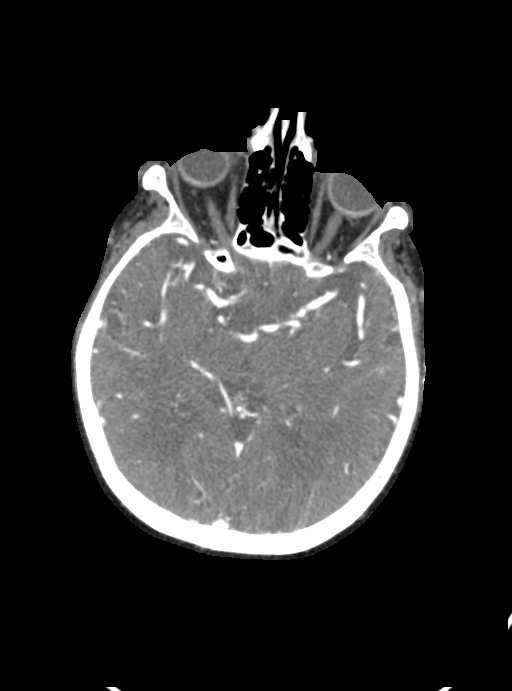
[im 304/355  bone]
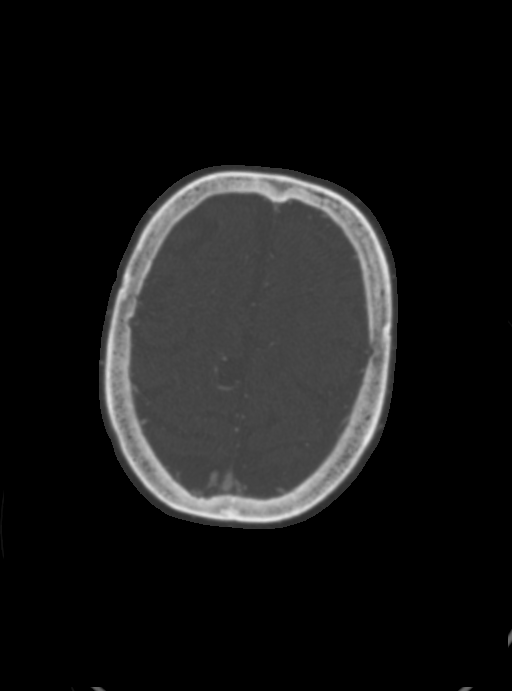

[8 of 33 positions shown; findings below may reference images not displayed]

RADIATION DOSE REDUCTION: This exam was performed according to the
departmental dose-optimization program which includes automated
exposure control, adjustment of the mA and/or kV according to
patient size and/or use of iterative reconstruction technique.

CONTRAST:  80mL OMNIPAQUE IOHEXOL 350 MG/ML SOLN
FINDINGS: CTA NECK FINDINGS

Aortic arch: Extensive atherosclerosis. Moderate stenosis of the
left subclavian artery origin.

Right carotid system: Atherosclerosis at the carotid bifurcation
with approximately 50% stenosis of the proximal ICA relative to the
distal vessel.

Left carotid system: Atherosclerosis at the carotid bifurcation with
approximately 50% stenosis of the proximal ICA relative to the
distal vessel.

Vertebral arteries: Severely diseased right vertebral artery,
including severe stenosis versus occlusion proximally and multifocal
severe stenosis throughout its the 2 course. There is occlusion at
the V3 level with irregular reconstitution intradurally. Moderate
stenosis of the left vertebral artery origin with multifocal mild
left V2 vertebral artery stenosis.

Skeleton: No evidence of acute abnormality. Multilevel degenerative
change, greatest at C5-C6 and C6-C7 and eccentric to the left.

Other neck: No evidence of acute abnormality.

Upper chest: Biapical pleuroparenchymal scarring. No consolidation
the visualized lung apices. Emphysema.

Review of the MIP images confirms the above findings

CTA HEAD FINDINGS

Anterior circulation: Bilateral intracranial ICAs are patent with
mild calcific atherosclerosis but no significant stenosis. Bilateral
MCAs and ACAs are patent without proximal hemodynamically
significant stenosis. Early right MCA bifurcation. No aneurysm
identified.

Posterior circulation: Occlusion of the right V3 vertebral artery
with irregular intradural reconstitution. Left intradural vertebral
artery is patent without significant stenosis. Basilar artery and
bilateral posterior cerebral arteries are patent without proximal
hemodynamically significant stenosis. No aneurysm identified.

Venous sinuses: As permitted by contrast timing, patent.

Review of the MIP images confirms the above findings
IMPRESSION: 1. Severely diseased right vertebral artery with severe stenosis
versus occlusion proximally, multifocal severe V2 stenosis, and V3
occlusion with irregular intradural reconstitution.
2. Approximately 50% stenosis of bilateral proximal ICAs in the
neck.
3. Moderate left vertebral artery and left subclavian origin
stenosis.
4. Aortic Atherosclerosis (KAZBS-CPM.M) and Emphysema (KAZBS-IJU.8).

## 2022-03-11 DIAGNOSIS — E039 Hypothyroidism, unspecified: Secondary | ICD-10-CM | POA: Diagnosis not present

## 2022-03-11 DIAGNOSIS — E559 Vitamin D deficiency, unspecified: Secondary | ICD-10-CM | POA: Diagnosis not present

## 2022-03-11 DIAGNOSIS — I509 Heart failure, unspecified: Secondary | ICD-10-CM | POA: Diagnosis not present

## 2022-03-11 DIAGNOSIS — F3342 Major depressive disorder, recurrent, in full remission: Secondary | ICD-10-CM | POA: Diagnosis not present

## 2022-03-11 DIAGNOSIS — J449 Chronic obstructive pulmonary disease, unspecified: Secondary | ICD-10-CM | POA: Diagnosis not present

## 2022-03-11 DIAGNOSIS — E261 Secondary hyperaldosteronism: Secondary | ICD-10-CM | POA: Diagnosis not present

## 2022-03-11 DIAGNOSIS — E049 Nontoxic goiter, unspecified: Secondary | ICD-10-CM | POA: Diagnosis not present

## 2022-03-11 DIAGNOSIS — E038 Other specified hypothyroidism: Secondary | ICD-10-CM | POA: Diagnosis not present

## 2022-03-11 DIAGNOSIS — G63 Polyneuropathy in diseases classified elsewhere: Secondary | ICD-10-CM | POA: Diagnosis not present

## 2022-03-11 DIAGNOSIS — E538 Deficiency of other specified B group vitamins: Secondary | ICD-10-CM | POA: Diagnosis not present

## 2022-03-11 DIAGNOSIS — I110 Hypertensive heart disease with heart failure: Secondary | ICD-10-CM | POA: Diagnosis not present

## 2022-03-11 DIAGNOSIS — E041 Nontoxic single thyroid nodule: Secondary | ICD-10-CM | POA: Diagnosis not present

## 2022-03-19 DIAGNOSIS — I10 Essential (primary) hypertension: Secondary | ICD-10-CM | POA: Diagnosis not present

## 2022-03-19 DIAGNOSIS — E785 Hyperlipidemia, unspecified: Secondary | ICD-10-CM | POA: Diagnosis not present

## 2022-03-19 DIAGNOSIS — G629 Polyneuropathy, unspecified: Secondary | ICD-10-CM | POA: Diagnosis not present

## 2022-03-19 DIAGNOSIS — E039 Hypothyroidism, unspecified: Secondary | ICD-10-CM | POA: Diagnosis not present

## 2022-04-02 DIAGNOSIS — R072 Precordial pain: Secondary | ICD-10-CM | POA: Diagnosis not present

## 2022-04-02 DIAGNOSIS — M25511 Pain in right shoulder: Secondary | ICD-10-CM | POA: Diagnosis not present

## 2022-04-02 DIAGNOSIS — I10 Essential (primary) hypertension: Secondary | ICD-10-CM | POA: Diagnosis not present

## 2022-04-02 DIAGNOSIS — E039 Hypothyroidism, unspecified: Secondary | ICD-10-CM | POA: Diagnosis not present

## 2022-04-02 DIAGNOSIS — M19041 Primary osteoarthritis, right hand: Secondary | ICD-10-CM | POA: Diagnosis not present

## 2022-04-02 DIAGNOSIS — M19042 Primary osteoarthritis, left hand: Secondary | ICD-10-CM | POA: Diagnosis not present

## 2022-04-02 DIAGNOSIS — G629 Polyneuropathy, unspecified: Secondary | ICD-10-CM | POA: Diagnosis not present

## 2022-04-02 DIAGNOSIS — M79674 Pain in right toe(s): Secondary | ICD-10-CM | POA: Diagnosis not present

## 2022-04-02 DIAGNOSIS — E785 Hyperlipidemia, unspecified: Secondary | ICD-10-CM | POA: Diagnosis not present

## 2022-04-02 DIAGNOSIS — K219 Gastro-esophageal reflux disease without esophagitis: Secondary | ICD-10-CM | POA: Diagnosis not present

## 2022-04-02 DIAGNOSIS — I700 Atherosclerosis of aorta: Secondary | ICD-10-CM | POA: Diagnosis not present

## 2022-04-02 DIAGNOSIS — G4700 Insomnia, unspecified: Secondary | ICD-10-CM | POA: Diagnosis not present

## 2022-04-26 DIAGNOSIS — M79674 Pain in right toe(s): Secondary | ICD-10-CM | POA: Diagnosis not present

## 2022-04-26 DIAGNOSIS — G629 Polyneuropathy, unspecified: Secondary | ICD-10-CM | POA: Diagnosis not present

## 2022-04-26 DIAGNOSIS — M79675 Pain in left toe(s): Secondary | ICD-10-CM | POA: Diagnosis not present

## 2022-05-01 IMAGING — DX DG ABDOMEN ACUTE W/ 1V CHEST
3 series · 3 of 3 positions shown · non-contrast
Comparison: 07/10/2020

CLINICAL DATA: Epigastric and right upper quadrant pain.

EXAM:
DG ABDOMEN ACUTE WITH 1 VIEW CHEST

[abdomen erect ap]
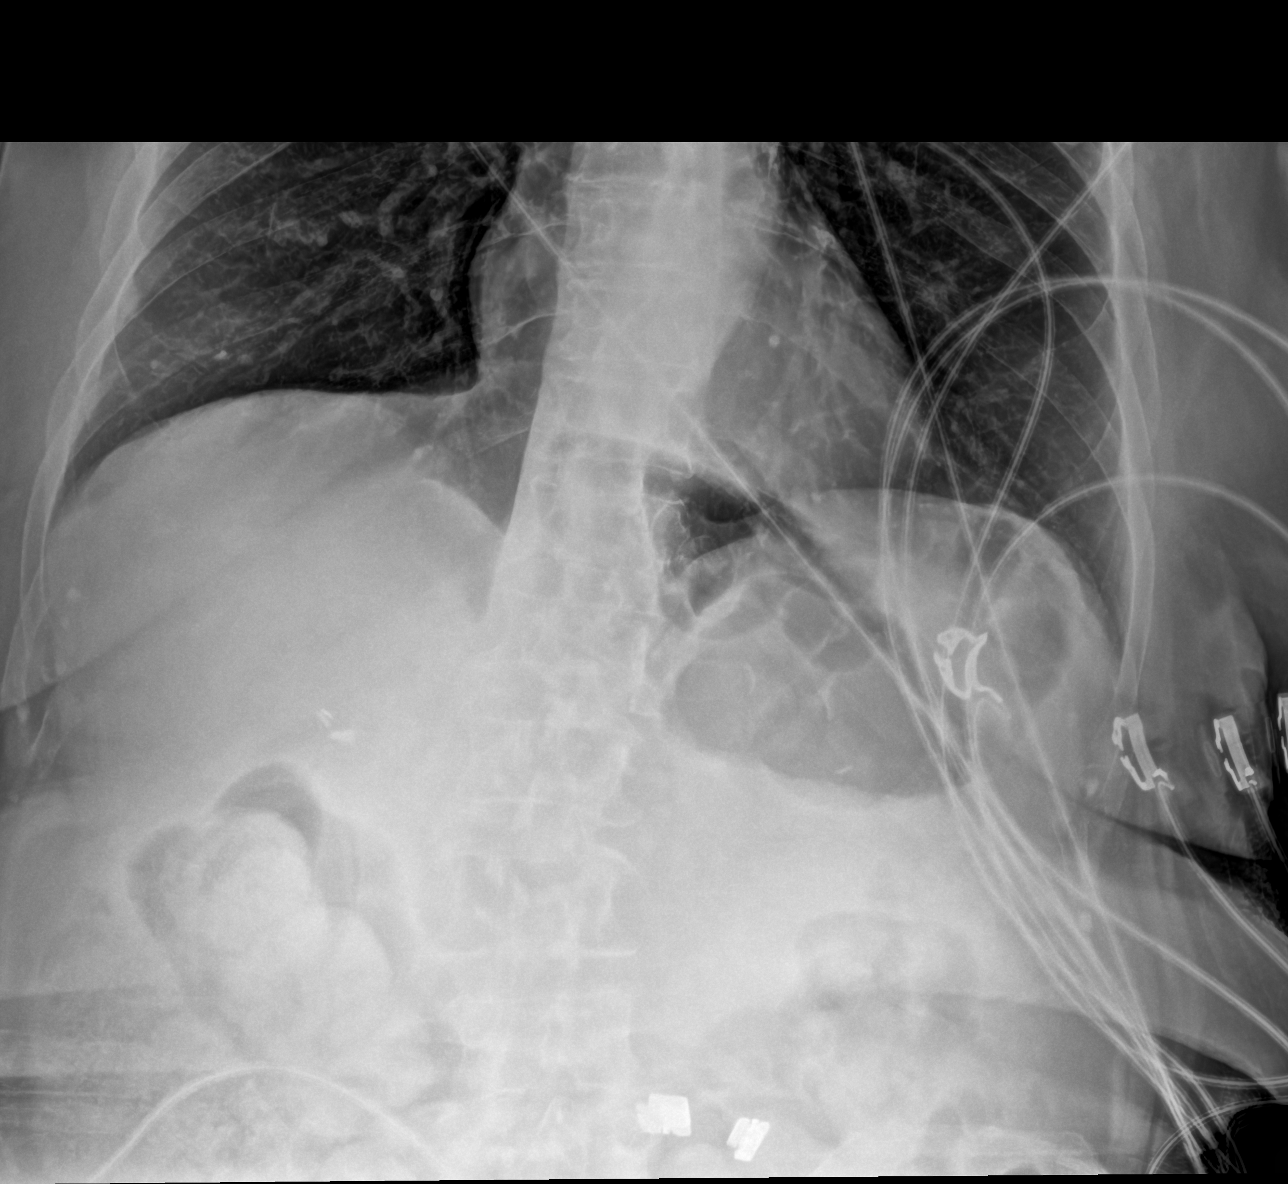

[abdomen supine]
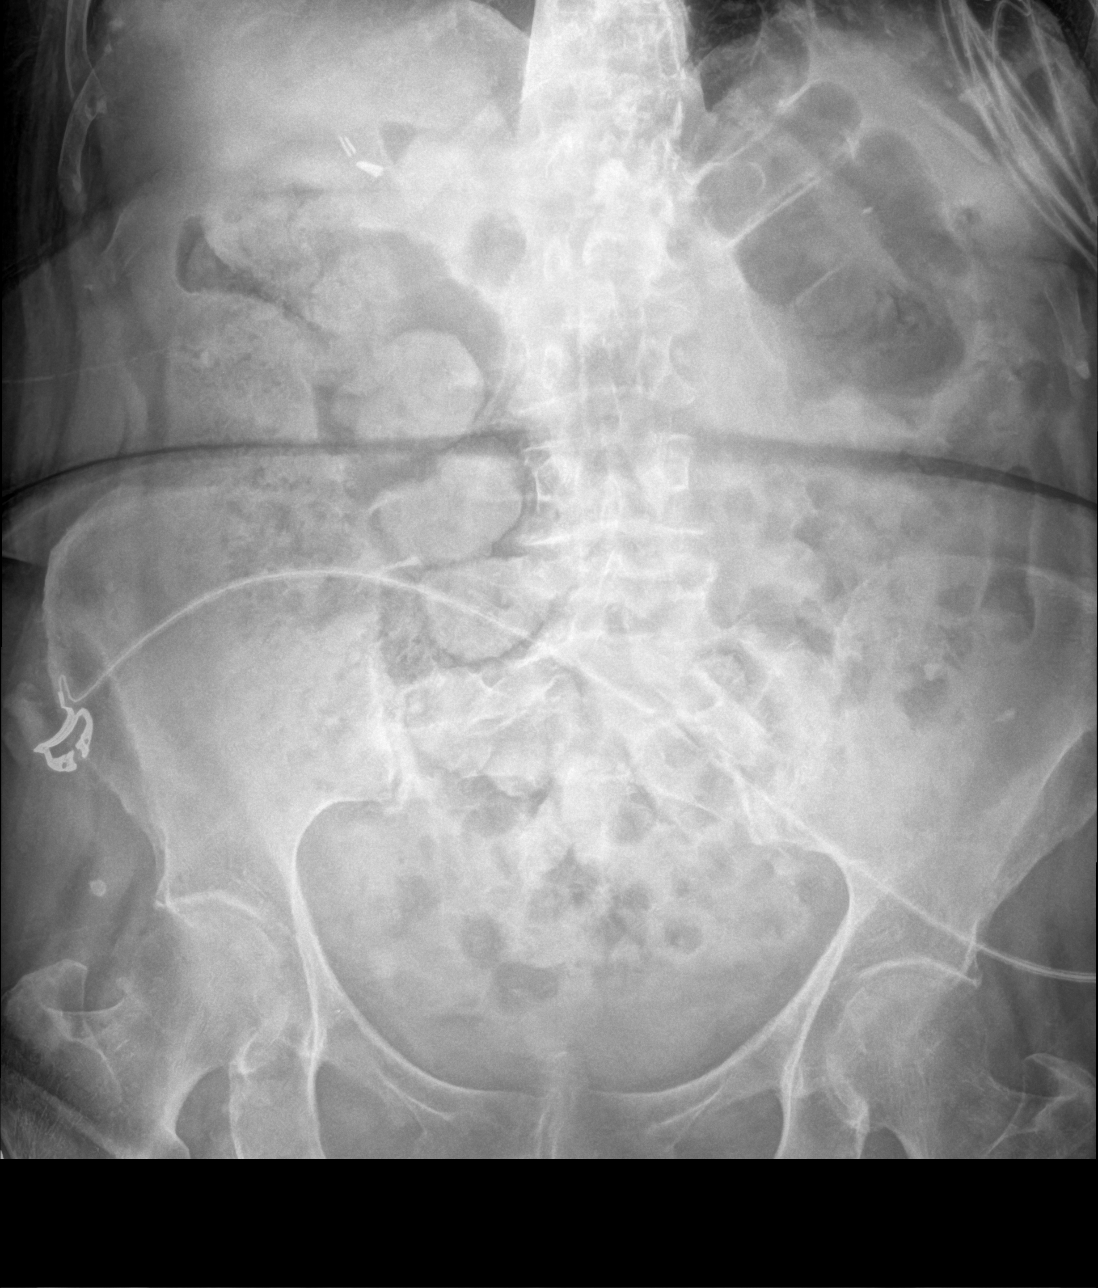

[chest ap]
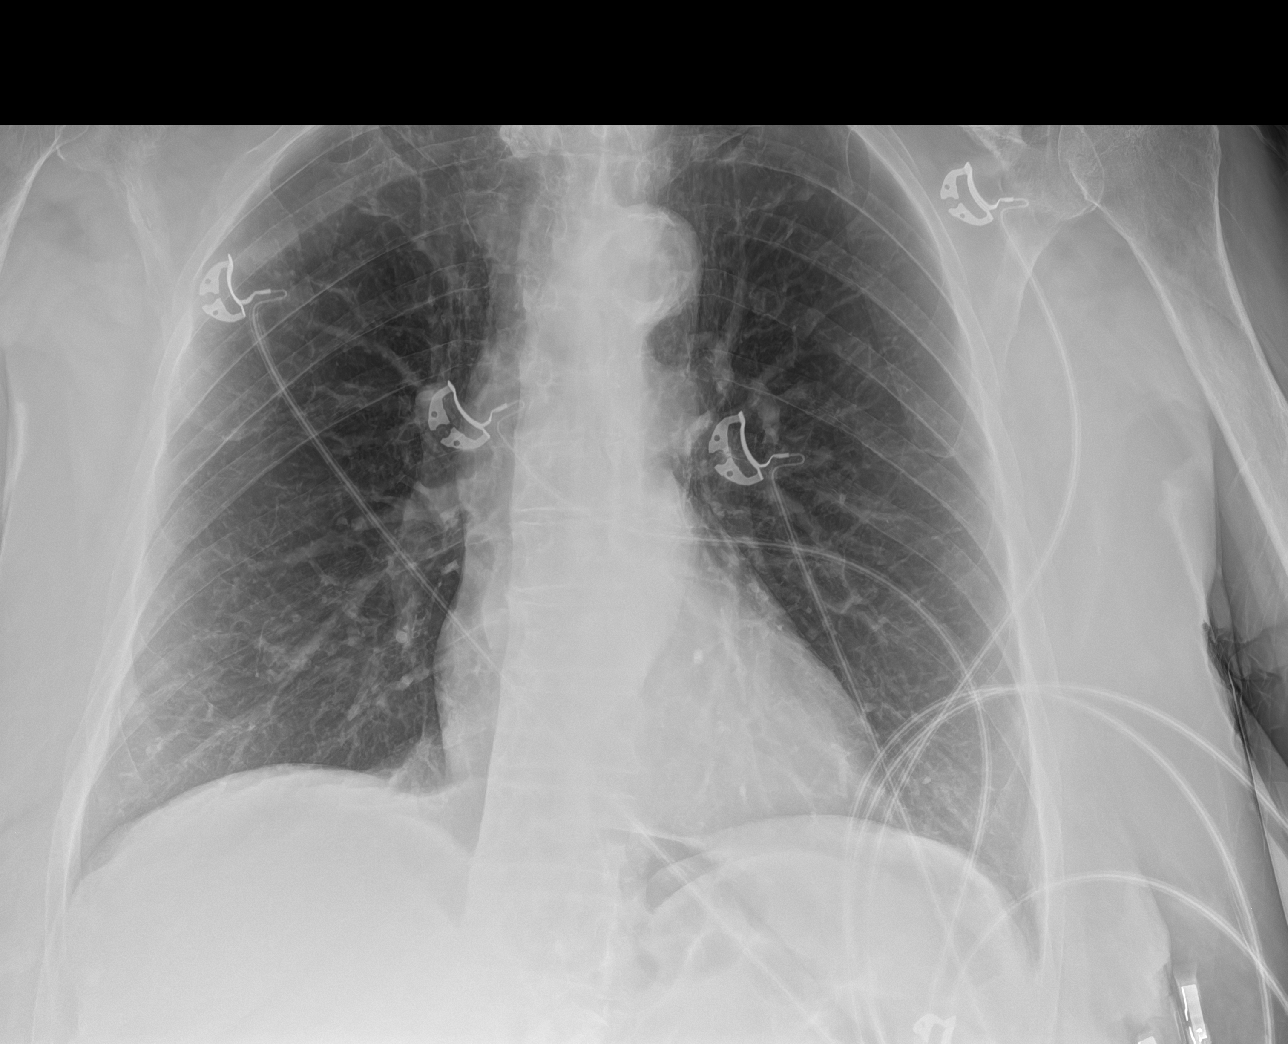

[3 of 3 positions shown; findings below may reference images not displayed]

FINDINGS: The lungs are clear without focal pneumonia, edema, pneumothorax or
pleural effusion. The cardiopericardial silhouette is within normal
limits for size. Telemetry leads overlie the chest.

Upright film shows no evidence for intraperitoneal free air. There
is no evidence for gaseous bowel dilation to suggest obstruction.
Air in stool are seen scattered along the length of a mildly
distended colon. Bones are demineralized. Surgical clips right upper
quadrant suggest prior cholecystectomy.
IMPRESSION: Negative abdominal radiographs.  No acute cardiopulmonary disease.

## 2022-05-06 ENCOUNTER — Encounter: Payer: Self-pay | Admitting: Radiology

## 2022-07-13 DIAGNOSIS — I10 Essential (primary) hypertension: Secondary | ICD-10-CM | POA: Diagnosis not present

## 2022-07-13 DIAGNOSIS — E785 Hyperlipidemia, unspecified: Secondary | ICD-10-CM | POA: Diagnosis not present

## 2022-07-13 DIAGNOSIS — G629 Polyneuropathy, unspecified: Secondary | ICD-10-CM | POA: Diagnosis not present

## 2022-07-13 DIAGNOSIS — E039 Hypothyroidism, unspecified: Secondary | ICD-10-CM | POA: Diagnosis not present

## 2022-07-17 DIAGNOSIS — K219 Gastro-esophageal reflux disease without esophagitis: Secondary | ICD-10-CM | POA: Diagnosis not present

## 2022-07-17 DIAGNOSIS — R070 Pain in throat: Secondary | ICD-10-CM | POA: Diagnosis not present

## 2022-07-17 DIAGNOSIS — Z6827 Body mass index (BMI) 27.0-27.9, adult: Secondary | ICD-10-CM | POA: Diagnosis not present

## 2022-07-17 DIAGNOSIS — Z79899 Other long term (current) drug therapy: Secondary | ICD-10-CM | POA: Diagnosis not present

## 2022-07-17 DIAGNOSIS — Z87891 Personal history of nicotine dependence: Secondary | ICD-10-CM | POA: Diagnosis not present

## 2022-07-17 DIAGNOSIS — Z713 Dietary counseling and surveillance: Secondary | ICD-10-CM | POA: Diagnosis not present

## 2022-07-17 DIAGNOSIS — I10 Essential (primary) hypertension: Secondary | ICD-10-CM | POA: Diagnosis not present

## 2022-07-24 DIAGNOSIS — M545 Low back pain, unspecified: Secondary | ICD-10-CM | POA: Diagnosis not present

## 2022-07-24 DIAGNOSIS — R072 Precordial pain: Secondary | ICD-10-CM | POA: Diagnosis not present

## 2022-07-24 DIAGNOSIS — M79674 Pain in right toe(s): Secondary | ICD-10-CM | POA: Diagnosis not present

## 2022-07-24 DIAGNOSIS — G4700 Insomnia, unspecified: Secondary | ICD-10-CM | POA: Diagnosis not present

## 2022-07-24 DIAGNOSIS — I10 Essential (primary) hypertension: Secondary | ICD-10-CM | POA: Diagnosis not present

## 2022-07-24 DIAGNOSIS — N3946 Mixed incontinence: Secondary | ICD-10-CM | POA: Diagnosis not present

## 2022-07-24 DIAGNOSIS — G629 Polyneuropathy, unspecified: Secondary | ICD-10-CM | POA: Diagnosis not present

## 2022-07-24 DIAGNOSIS — M25511 Pain in right shoulder: Secondary | ICD-10-CM | POA: Diagnosis not present

## 2022-07-24 DIAGNOSIS — E785 Hyperlipidemia, unspecified: Secondary | ICD-10-CM | POA: Diagnosis not present

## 2022-07-24 DIAGNOSIS — Z79899 Other long term (current) drug therapy: Secondary | ICD-10-CM | POA: Diagnosis not present

## 2022-07-24 DIAGNOSIS — I700 Atherosclerosis of aorta: Secondary | ICD-10-CM | POA: Diagnosis not present

## 2022-07-24 DIAGNOSIS — G8929 Other chronic pain: Secondary | ICD-10-CM | POA: Diagnosis not present

## 2022-07-29 DIAGNOSIS — F17210 Nicotine dependence, cigarettes, uncomplicated: Secondary | ICD-10-CM | POA: Diagnosis not present

## 2022-07-29 DIAGNOSIS — J449 Chronic obstructive pulmonary disease, unspecified: Secondary | ICD-10-CM | POA: Diagnosis not present

## 2022-08-23 DIAGNOSIS — L600 Ingrowing nail: Secondary | ICD-10-CM | POA: Diagnosis not present

## 2022-08-23 DIAGNOSIS — M79675 Pain in left toe(s): Secondary | ICD-10-CM | POA: Diagnosis not present

## 2022-08-23 DIAGNOSIS — G629 Polyneuropathy, unspecified: Secondary | ICD-10-CM | POA: Diagnosis not present

## 2022-08-23 DIAGNOSIS — M79674 Pain in right toe(s): Secondary | ICD-10-CM | POA: Diagnosis not present

## 2022-08-30 DIAGNOSIS — G4700 Insomnia, unspecified: Secondary | ICD-10-CM | POA: Diagnosis not present

## 2022-08-30 DIAGNOSIS — H04123 Dry eye syndrome of bilateral lacrimal glands: Secondary | ICD-10-CM | POA: Diagnosis not present

## 2022-09-22 DIAGNOSIS — M75101 Unspecified rotator cuff tear or rupture of right shoulder, not specified as traumatic: Secondary | ICD-10-CM | POA: Diagnosis not present

## 2022-09-22 DIAGNOSIS — M67813 Other specified disorders of tendon, right shoulder: Secondary | ICD-10-CM | POA: Diagnosis not present

## 2022-09-29 ENCOUNTER — Ambulatory Visit: Payer: PPO | Admitting: Orthopedic Surgery

## 2022-09-29 ENCOUNTER — Encounter: Payer: Self-pay | Admitting: Orthopedic Surgery

## 2022-09-29 VITALS — BP 124/80 | HR 85 | Ht 66.25 in | Wt 164.0 lb

## 2022-09-29 DIAGNOSIS — M7581 Other shoulder lesions, right shoulder: Secondary | ICD-10-CM | POA: Diagnosis not present

## 2022-09-29 NOTE — Progress Notes (Signed)
[facility] Clinic Return  Assessment: SANTIANA Williamson is a 88 y.o. female with the following: Right shoulder rotator cuff tendinitis  Plan: [name] continues to have right shoulder pain.  Prior injections have been effective.  She is interested in another injection today.  She also complains of pain in the bilateral hips, over the greater trochanter laterally.  We briefly discussed this.  She can consider topical treatments or medicines as needed.  If interested, she can return for an injection.  Procedure note injection - Right shoulder    Verbal consent was obtained to inject the right shoulder, subacromial space Timeout was completed to confirm the site of injection.   The skin was prepped with alcohol and ethyl chloride was sprayed at the injection site.  A 21-gauge needle was used to inject 40 mg of Depo-Medrol and 1% lidocaine (3 cc) into the subacromial space of the right shoulder using a posterolateral approach.  There were no complications.  A sterile bandage was applied.    Follow-up: Return if symptoms worsen or fail to improve.   Subjective:  Chief Complaint  Patient presents with   Shoulder Pain    R shoulder pain back for a few weeks.     History of Present Illness: Linda Williamson is a 88 y.o. female who returns to clinic for evaluation of right shoulder pain.  I have seen her for her right shoulder in the past.  Injections have been effective.  Over the past couple weeks, she has noted progressively worsening pain in the right shoulder.  She is using topical patches to help with her pain.  She also has pain in the lateral hips, over the trochanters bilaterally.  She complains of stiffness and pain in both hands 2.   Review of Systems: No fevers or chills No numbness or tingling No chest pain No shortness of breath No bowel or bladder dysfunction No GI distress No headaches   Objective: BP 124/80   Pulse 85   Ht 5' 6.25" (1.683 m)   Wt 164 lb (74.4  kg)   BMI 26.27 kg/m   Physical Exam:  Evaluation of right shoulder demonstrates no deformity.  No atrophy is appreciated.  Tenderness to palpation over the anterior and lateral aspect of the shoulder.  Slightly restricted forward elevation.  Pain with internal rotation of the lumbar spine.  4+/5 strength supraspinatus, infraspinatus strength testing.  Negative belly press.  IMAGING: I personally ordered and reviewed the following images:  No new imaging obtained today  Oliver Barre, MD 09/29/2022 2:04 PM

## 2022-10-15 ENCOUNTER — Ambulatory Visit: Payer: PPO | Admitting: Orthopedic Surgery

## 2022-10-22 DIAGNOSIS — E039 Hypothyroidism, unspecified: Secondary | ICD-10-CM | POA: Diagnosis not present

## 2022-10-22 DIAGNOSIS — E785 Hyperlipidemia, unspecified: Secondary | ICD-10-CM | POA: Diagnosis not present

## 2022-10-22 DIAGNOSIS — G629 Polyneuropathy, unspecified: Secondary | ICD-10-CM | POA: Diagnosis not present

## 2022-10-22 DIAGNOSIS — I10 Essential (primary) hypertension: Secondary | ICD-10-CM | POA: Diagnosis not present

## 2022-10-26 DIAGNOSIS — I700 Atherosclerosis of aorta: Secondary | ICD-10-CM | POA: Diagnosis not present

## 2022-10-26 DIAGNOSIS — N3946 Mixed incontinence: Secondary | ICD-10-CM | POA: Diagnosis not present

## 2022-10-26 DIAGNOSIS — J449 Chronic obstructive pulmonary disease, unspecified: Secondary | ICD-10-CM | POA: Diagnosis not present

## 2022-10-26 DIAGNOSIS — E039 Hypothyroidism, unspecified: Secondary | ICD-10-CM | POA: Diagnosis not present

## 2022-10-26 DIAGNOSIS — R072 Precordial pain: Secondary | ICD-10-CM | POA: Diagnosis not present

## 2022-10-26 DIAGNOSIS — M25511 Pain in right shoulder: Secondary | ICD-10-CM | POA: Diagnosis not present

## 2022-10-26 DIAGNOSIS — M79674 Pain in right toe(s): Secondary | ICD-10-CM | POA: Diagnosis not present

## 2022-10-26 DIAGNOSIS — K219 Gastro-esophageal reflux disease without esophagitis: Secondary | ICD-10-CM | POA: Diagnosis not present

## 2022-10-26 DIAGNOSIS — E785 Hyperlipidemia, unspecified: Secondary | ICD-10-CM | POA: Diagnosis not present

## 2022-10-26 DIAGNOSIS — G629 Polyneuropathy, unspecified: Secondary | ICD-10-CM | POA: Diagnosis not present

## 2022-10-26 DIAGNOSIS — I10 Essential (primary) hypertension: Secondary | ICD-10-CM | POA: Diagnosis not present

## 2022-10-26 DIAGNOSIS — G4700 Insomnia, unspecified: Secondary | ICD-10-CM | POA: Diagnosis not present

## 2022-11-15 ENCOUNTER — Ambulatory Visit: Payer: PPO | Admitting: Adult Health

## 2022-11-15 ENCOUNTER — Encounter: Payer: Self-pay | Admitting: Adult Health

## 2022-11-15 VITALS — BP 157/81 | HR 93 | Ht 66.25 in | Wt 165.5 lb

## 2022-11-15 DIAGNOSIS — N952 Postmenopausal atrophic vaginitis: Secondary | ICD-10-CM

## 2022-11-15 DIAGNOSIS — Z90710 Acquired absence of both cervix and uterus: Secondary | ICD-10-CM

## 2022-11-15 DIAGNOSIS — R300 Dysuria: Secondary | ICD-10-CM

## 2022-11-15 LAB — POCT URINALYSIS DIPSTICK: Protein, UA: POSITIVE — AB

## 2022-11-15 MED ORDER — SULFAMETHOXAZOLE-TRIMETHOPRIM 800-160 MG PO TABS: 1.0000 | ORAL_TABLET | Freq: Two times a day (BID) | ORAL | 0 refills | Status: DC

## 2022-11-15 MED ORDER — ESTRADIOL 0.1 MG/GM VA CREA
TOPICAL_CREAM | VAGINAL | 1 refills | Status: DC
Start: 2022-11-15 — End: 2023-08-10

## 2022-11-15 NOTE — Progress Notes (Signed)
  Subjective:     Patient ID: Linda Williamson, female   DOB: [birth year], 88 y.o.   MRN: [id number]  HPI Linda Williamson is a 88 year old white female, widowed, sp hysterectomy in complaining of burning with urination for about a week.  PCP is Dr Margo Aye  Review of Systems +burning with urination Reviewed past medical,surgical, social and family history. Reviewed medications and allergies.     Objective:   Physical Exam BP (!) 157/81 (BP Location: Right Arm, Patient Position: Sitting, Cuff Size: Normal)   Pulse 93   Ht 5' 6.25" (1.683 m)   Wt 165 lb 8 oz (75.1 kg)   BMI 26.51 kg/m  urine dipstick, 3+blood, 2+ protein and 3+leuks Skin warm and dry.Pelvic: external genitalia is normal in appearance no lesions, vagina: pale and atrophic, can feel mesh,urethra has no lesions or masses noted, cervix and uterus are absent, adnexa: no masses or tenderness noted. Bladder is non tender and no masses felt.  She was teary today, family issues.    Fall risk is low  Upstream - 11/15/22 1036       Pregnancy Intention Screening   Does the patient want to become pregnant in the next year? N/A    Does the patient's partner want to become pregnant in the next year? N/A    Would the patient like to discuss contraceptive options today? N/A      Contraception Wrap Up   Current Method Female Sterilization   hyst   End Method Female Sterilization   hyst   Contraception Counseling Provided No            Examination chaperoned by Malachy Mood LPN  Assessment:     1. Burning with urination UA C&S sent  Will rx septra ds  Meds ordered this encounter  Medications   estradiol (ESTRACE) 0.1 MG/GM vaginal cream    Sig: Use 0.5 gm in vaginal 2-3 x weekly    Dispense:  42.5 g    Refill:  1    Order Specific Question:   Supervising Provider    Answer:   Duane Lope H [[id number]]   sulfamethoxazole-trimethoprim (BACTRIM DS) 800-160 MG tablet    Sig: Take 1 tablet by mouth 2 (two) times daily. Take 1 bid     Dispense:  14 tablet    Refill:  0    Order Specific Question:   Supervising Provider    Answer:   Duane Lope H [[id number]]    - Urine Culture - Urinalysis, Routine w reflex microscopic  2. S/P hysterectomy   3. Vaginal atrophy +atrophy Will refill estrace vaginal cream to use 2-3 x weekly     Plan:     Follow up prn

## 2022-11-16 LAB — MICROSCOPIC EXAMINATION
Casts: NONE SEEN /LPF
RBC, Urine: >30 /HPF — AB (ref 0–2)
WBC, UA: >30 /HPF — AB (ref 0–5)

## 2022-11-16 LAB — URINALYSIS, ROUTINE W REFLEX MICROSCOPIC
Bilirubin, UA: NEGATIVE
Glucose, UA: NEGATIVE
Ketones, UA: NEGATIVE
Nitrite, UA: NEGATIVE
Specific Gravity, UA: 1.02 (ref 1.005–1.030)
Urobilinogen, Ur: 0.2 mg/dL (ref 0.2–1.0)
pH, UA: 7 (ref 5.0–7.5)

## 2022-11-18 ENCOUNTER — Telehealth: Payer: Self-pay | Admitting: Adult Health

## 2022-11-18 LAB — URINE CULTURE

## 2022-11-18 NOTE — Telephone Encounter (Signed)
Pt is feeling better.

## 2022-12-01 DIAGNOSIS — H04123 Dry eye syndrome of bilateral lacrimal glands: Secondary | ICD-10-CM | POA: Diagnosis not present

## 2022-12-28 ENCOUNTER — Other Ambulatory Visit (HOSPITAL_COMMUNITY): Payer: Self-pay | Admitting: Family Medicine

## 2022-12-28 ENCOUNTER — Ambulatory Visit (HOSPITAL_COMMUNITY)
Admission: RE | Admit: 2022-12-28 | Discharge: 2022-12-28 | Disposition: A | Discharge status: Home / Self Care | Payer: PPO | Source: Ambulatory Visit | Attending: Family Medicine | Admitting: Family Medicine

## 2022-12-28 DIAGNOSIS — M25512 Pain in left shoulder: Secondary | ICD-10-CM | POA: Insufficient documentation

## 2022-12-28 DIAGNOSIS — M19012 Primary osteoarthritis, left shoulder: Secondary | ICD-10-CM | POA: Diagnosis not present

## 2022-12-28 DIAGNOSIS — Z79899 Other long term (current) drug therapy: Secondary | ICD-10-CM | POA: Diagnosis not present

## 2022-12-28 DIAGNOSIS — M25519 Pain in unspecified shoulder: Secondary | ICD-10-CM | POA: Diagnosis not present

## 2023-01-19 ENCOUNTER — Telehealth: Payer: Self-pay | Admitting: Adult Health

## 2023-01-19 NOTE — Telephone Encounter (Signed)
Pt wonders if lexapro would help her feel less depressed, told her it might, can make appt or see PCP. She has friend that takes it and says it helps

## 2023-01-19 NOTE — Telephone Encounter (Signed)
Patient called asking for a rx and asked speak to speak to you states that she called you last night and you had asked her to call the office back. She wants to know if you would call her at [phone]

## 2023-01-27 DIAGNOSIS — M25512 Pain in left shoulder: Secondary | ICD-10-CM | POA: Diagnosis not present

## 2023-01-27 DIAGNOSIS — G8929 Other chronic pain: Secondary | ICD-10-CM | POA: Diagnosis not present

## 2023-01-27 DIAGNOSIS — M1990 Unspecified osteoarthritis, unspecified site: Secondary | ICD-10-CM | POA: Diagnosis not present

## 2023-01-27 DIAGNOSIS — G4700 Insomnia, unspecified: Secondary | ICD-10-CM | POA: Diagnosis not present

## 2023-01-27 DIAGNOSIS — M25511 Pain in right shoulder: Secondary | ICD-10-CM | POA: Diagnosis not present

## 2023-02-15 DIAGNOSIS — H04123 Dry eye syndrome of bilateral lacrimal glands: Secondary | ICD-10-CM | POA: Diagnosis not present

## 2023-02-21 DIAGNOSIS — I10 Essential (primary) hypertension: Secondary | ICD-10-CM | POA: Diagnosis not present

## 2023-02-21 DIAGNOSIS — E785 Hyperlipidemia, unspecified: Secondary | ICD-10-CM | POA: Diagnosis not present

## 2023-02-21 DIAGNOSIS — E039 Hypothyroidism, unspecified: Secondary | ICD-10-CM | POA: Diagnosis not present

## 2023-02-21 DIAGNOSIS — G629 Polyneuropathy, unspecified: Secondary | ICD-10-CM | POA: Diagnosis not present

## 2023-02-25 DIAGNOSIS — J302 Other seasonal allergic rhinitis: Secondary | ICD-10-CM | POA: Diagnosis not present

## 2023-02-25 DIAGNOSIS — G629 Polyneuropathy, unspecified: Secondary | ICD-10-CM | POA: Diagnosis not present

## 2023-02-25 DIAGNOSIS — G4700 Insomnia, unspecified: Secondary | ICD-10-CM | POA: Diagnosis not present

## 2023-02-25 DIAGNOSIS — M79674 Pain in right toe(s): Secondary | ICD-10-CM | POA: Diagnosis not present

## 2023-02-25 DIAGNOSIS — Z23 Encounter for immunization: Secondary | ICD-10-CM | POA: Diagnosis not present

## 2023-02-25 DIAGNOSIS — J449 Chronic obstructive pulmonary disease, unspecified: Secondary | ICD-10-CM | POA: Diagnosis not present

## 2023-02-25 DIAGNOSIS — I700 Atherosclerosis of aorta: Secondary | ICD-10-CM | POA: Diagnosis not present

## 2023-02-25 DIAGNOSIS — I10 Essential (primary) hypertension: Secondary | ICD-10-CM | POA: Diagnosis not present

## 2023-02-25 DIAGNOSIS — M25511 Pain in right shoulder: Secondary | ICD-10-CM | POA: Diagnosis not present

## 2023-02-25 DIAGNOSIS — N3946 Mixed incontinence: Secondary | ICD-10-CM | POA: Diagnosis not present

## 2023-02-25 DIAGNOSIS — E785 Hyperlipidemia, unspecified: Secondary | ICD-10-CM | POA: Diagnosis not present

## 2023-02-25 DIAGNOSIS — R072 Precordial pain: Secondary | ICD-10-CM | POA: Diagnosis not present

## 2023-08-10 ENCOUNTER — Other Ambulatory Visit: Payer: Self-pay | Admitting: Adult Health

## 2023-10-18 ENCOUNTER — Encounter: Payer: Self-pay | Admitting: Gastroenterology

## 2023-11-20 NOTE — Progress Notes (Signed)
 GI Office Note    Referring Provider: Hyacinth Honey, NP Primary Care Physician:  Hyacinth Honey, NP  Primary Gastroenterologist: Ozell Hollingshead, MD   Chief Complaint   Chief Complaint  Patient presents with   Dysphagia    Has trouble swallowing at times     History of Present Illness   Linda Williamson is a 89 y.o. female presenting today at the request of Honey Hyacinth, NP for dysphagia. She was last seen in 2023 at our other GI office for prior alternating constipation and diarrhea which had already resolved.  She had declined colonoscopy for colon cancer screening at that time.  She had had CT around that time which was fairly unremarkable.   Discussed the use of AI scribe software for clinical note transcription with the patient, who gave verbal consent to proceed.   She experiences swallowing difficulties, with episodes where pills and food do not pass easily. She sometimes regurgitates to relieve the sensation. The issue worsens when she eats quickly, and she avoids foods requiring extensive chewing, such as meat or chicken, to prevent these episodes.  She denies heartburn.  No abdominal pain.  Bowel movements fairly regular.  No blood in the stool or melena.   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  She does not recall what type of treatments that she had.  She had an endoscopy around 2011 noted to have a Schatzki ring which was dilated.   She quit smoking three weeks ago after a brief relapse. She consumes a glass of wine nightly, sometimes more if needed.    Prior Data   Labs 08/2023: TSH 3.49, white blood cell count 6.4, hemoglobin 13.1, platelets 191, glucose 90, creatinine 0.65, sodium 140, albumin 3.9, total bilirubin 0.9, ALT 11, A1c 5.4  EGD 2011: -schatzki ring s/p dilation/erosive reflux esophagitis. -gastritis no h.pylori  Colonoscopy 2006: -small AVM, nonbleeding, cecum -normal TI -internal hemorrhoids  Medications   Current  Outpatient Medications  Medication Sig Dispense Refill   Acetaminophen  (TYLENOL  PO) Take by mouth.     ALPRAZolam  (XANAX ) 0.5 MG tablet Take 0.5 mg by mouth at bedtime.     amLODipine  (NORVASC ) 2.5 MG tablet Take 2.5 mg by mouth daily.     cyanocobalamin (VITAMIN B12) 500 MCG tablet Take 500 mcg by mouth daily.     levothyroxine  (SYNTHROID ) 75 MCG tablet TAKE (1) TABLET BY MOUTH ONCE DAILY.     pantoprazole  (PROTONIX ) 20 MG tablet Take 20 mg by mouth daily.     traMADol (ULTRAM) 50 MG tablet Take 50 mg by mouth every 12 (twelve) hours.     VITAMIN D, CHOLECALCIFEROL, PO Take by mouth.     Zinc Sulfate (ZINC 15 PO) Take by mouth.     No current facility-administered medications for this visit.    Allergies   Allergies as of 11/21/2023 - Review Complete 11/21/2023  Allergen Reaction Noted   Nitrofurantoin Hives 02/23/2021   Keflex  [cephalexin ]  09/23/2018   Albuterol  Palpitations 02/23/2021   Cephalosporins Rash 11/15/2022   Penicillins Rash 12/05/2009    Past Medical History   Past Medical History:  Diagnosis Date   Bilateral lower extremity edema    Bilateral lower extremity edema 04/2013   Cancer (HCC)    vocal cord   Cataract    Hypertension    Thyroid  disease     Past Surgical History   Past Surgical History:  Procedure Laterality Date   ABDOMINAL HYSTERECTOMY  APPENDECTOMY     CATARACT EXTRACTION     CHOLECYSTECTOMY     THROAT SURGERY      Past Family History   Family History  Problem Relation Age of Onset   Heart failure Mother    Cancer Father        Bladder   COPD Brother    Heart disease Sister    COPD Brother     Past Social History   Social History   Socioeconomic History   Marital status: Widowed    Spouse name: Not on file   Number of children: 3   Years of education: Not on file   Highest education level: Not on file  Occupational History   Not on file  Tobacco Use   Smoking status: Former    Current packs/day: 0.00     Types: Cigarettes    Quit date: 02/01/2019    Years since quitting: 4.8   Smokeless tobacco: Never  Vaping Use   Vaping status: Never Used  Substance and Sexual Activity   Alcohol use: Yes    Comment: glass of wine occasionally    Drug use: No   Sexual activity: Not Currently    Birth control/protection: Surgical    Comment: hyst  Other Topics Concern   Not on file  Social History Narrative   Not on file   Social Drivers of Health   Financial Resource Strain: Low Risk  (07/28/2021)   Received from [facility]   Overall Financial Resource Strain (CARDIA)    Difficulty of Paying Living Expenses: Not hard at all  Food Insecurity: No Food Insecurity (07/28/2021)   Received from [facility]   Hunger Vital Sign    Within the past 12 months, you worried that your food would run out before you got the money to buy more.: Never true    Within the past 12 months, the food you bought just didn't last and you didn't have money to get more.: Never true  Transportation Needs: No Transportation Needs (06/30/2021)   Received from [facility]   PRAPARE - Transportation    Lack of Transportation (Medical): No    Lack of Transportation (Non-Medical): No  Physical Activity: Sufficiently Active (04/30/2021)   Received from [facility]   Exercise Vital Sign    On average, how many days per week do you engage in moderate to strenuous exercise (like a brisk walk)?: 6 days    On average, how many minutes do you engage in exercise at this level?: 30 min  Stress: No Stress Concern Present (07/28/2021)   Received from [facility] - Occupational Stress Questionnaire    Feeling of Stress : Not at all  Social Connections: Moderately Integrated (04/30/2021)   Received from [facility]   Social Connection and Isolation Panel    In a typical week, how many times do you talk on the phone with family, friends, or neighbors?: Three times a week     How often do you get together with friends or relatives?: Twice a week    How often do you attend church or religious services?: 1 to 4 times per year    Do you belong to any clubs or organizations such as church groups, unions, fraternal or athletic groups, or school groups?: Yes    How often do you attend meetings of the clubs or organizations you belong to?: 1 to 4 times per  year    Are you married, widowed, divorced, separated, never married, or living with a partner?: Widowed  Intimate Partner Violence: Not At Risk (04/30/2021)   Received from [facility]   Humiliation, Afraid, Rape, and Kick questionnaire    Within the last year, have you been afraid of your partner or ex-partner?: No    Within the last year, have you been humiliated or emotionally abused in other ways by your partner or ex-partner?: No    Within the last year, have you been kicked, hit, slapped, or otherwise physically hurt by your partner or ex-partner?: No    Within the last year, have you been raped or forced to have any kind of sexual activity by your partner or ex-partner?: No    Review of Systems   General: Negative for anorexia, weight loss, fever, chills, fatigue, weakness. Eyes: Negative for vision changes.  ENT: Negative for nasal congestion. See hpi. +chronic hoarseness CV: Negative for chest pain, angina, palpitations, dyspnea on exertion, peripheral edema.  Respiratory: Negative for dyspnea at rest, dyspnea on exertion, cough, sputum, wheezing.  GI: See history of present illness. GU:  Negative for dysuria, hematuria, urinary incontinence, urinary frequency, nocturnal urination.  MS: Negative for joint pain, low back pain.  Derm: Negative for rash or itching.  Neuro: Negative for weakness, abnormal sensation, seizure, frequent headaches, memory loss,  confusion.  Psych: Negative for anxiety, depression, suicidal ideation, hallucinations.  Endo: Negative for unusual weight change.  Heme: Negative  for bruising or bleeding. Allergy: Negative for rash or hives.  Physical Exam   BP (!) 154/67 (BP Location: Right Arm, Patient Position: Sitting, Cuff Size: Normal)   Pulse 65   Temp 98 F (36.7 C) (Oral)   Ht 5' 9 (1.753 m)   Wt 168 lb 12.8 oz (76.6 kg)   SpO2 93%   BMI 24.93 kg/m    General: Well-nourished, well-developed in no acute distress.  Head: Normocephalic, atraumatic.   Eyes: Conjunctiva pink, no icterus. Mouth: Oropharyngeal mucosa moist and pink  Neck: Supple without thyromegaly, masses, or lymphadenopathy.  Lungs: Clear to auscultation bilaterally.  Heart: Regular rate and rhythm, no murmurs rubs or gallops.  Abdomen: Bowel sounds are normal, nontender, nondistended, no hepatosplenomegaly or masses,  no abdominal bruits or hernia, no rebound or guarding.   Rectal: not performed Extremities: No lower extremity edema. No clubbing or deformities.  Neuro: Alert and oriented x 4 , grossly normal neurologically.  Skin: Warm and dry, no rash or jaundice.   Psych: Alert and cooperative, normal mood and affect.  Labs   See above  Imaging Studies   No results found.  Assessment/Plan:   Dysphagia: - with history of Schatzki ring in the past s/p dilation in 2011 -remote history of vocal cord cancer -EGD/ED with Dr. Shaaron. ASA 2.  I have discussed the risks, alternatives, benefits with regards to but not limited to the risk of reaction to medication, bleeding, infection, perforation and the patient is agreeable to proceed. Written consent to be obtained. -avoid hard/dry foods until esophageal dilation       Sonny RAMAN. Ezzard, MHS, PA-C [facility]

## 2023-11-20 NOTE — H&P (View-Only) (Signed)
 GI Office Note    Referring Provider: Hyacinth Honey, NP Primary Care Physician:  Hyacinth Honey, NP  Primary Gastroenterologist: Ozell Hollingshead, MD   Chief Complaint   Chief Complaint  Patient presents with   Dysphagia    Has trouble swallowing at times     History of Present Illness   Linda Williamson is a 89 y.o. female presenting today at the request of Honey Hyacinth, NP for dysphagia. She was last seen in 2023 at our other GI office for prior alternating constipation and diarrhea which had already resolved.  She had declined colonoscopy for colon cancer screening at that time.  She had had CT around that time which was fairly unremarkable.   Discussed the use of AI scribe software for clinical note transcription with the patient, who gave verbal consent to proceed.   She experiences swallowing difficulties, with episodes where pills and food do not pass easily. She sometimes regurgitates to relieve the sensation. The issue worsens when she eats quickly, and she avoids foods requiring extensive chewing, such as meat or chicken, to prevent these episodes.  She denies heartburn.  No abdominal pain.  Bowel movements fairly regular.  No blood in the stool or melena.   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  She does not recall what type of treatments that she had.  She had an endoscopy around 2011 noted to have a Schatzki ring which was dilated.   She quit smoking three weeks ago after a brief relapse. She consumes a glass of wine nightly, sometimes more if needed.    Prior Data   Labs 08/2023: TSH 3.49, white blood cell count 6.4, hemoglobin 13.1, platelets 191, glucose 90, creatinine 0.65, sodium 140, albumin 3.9, total bilirubin 0.9, ALT 11, A1c 5.4  EGD 2011: -schatzki ring s/p dilation/erosive reflux esophagitis. -gastritis no h.pylori  Colonoscopy 2006: -small AVM, nonbleeding, cecum -normal TI -internal hemorrhoids  Medications   Current  Outpatient Medications  Medication Sig Dispense Refill   Acetaminophen  (TYLENOL  PO) Take by mouth.     ALPRAZolam  (XANAX ) 0.5 MG tablet Take 0.5 mg by mouth at bedtime.     amLODipine  (NORVASC ) 2.5 MG tablet Take 2.5 mg by mouth daily.     cyanocobalamin (VITAMIN B12) 500 MCG tablet Take 500 mcg by mouth daily.     levothyroxine  (SYNTHROID ) 75 MCG tablet TAKE (1) TABLET BY MOUTH ONCE DAILY.     pantoprazole  (PROTONIX ) 20 MG tablet Take 20 mg by mouth daily.     traMADol (ULTRAM) 50 MG tablet Take 50 mg by mouth every 12 (twelve) hours.     VITAMIN D, CHOLECALCIFEROL, PO Take by mouth.     Zinc Sulfate (ZINC 15 PO) Take by mouth.     No current facility-administered medications for this visit.    Allergies   Allergies as of 11/21/2023 - Review Complete 11/21/2023  Allergen Reaction Noted   Nitrofurantoin Hives 02/23/2021   Keflex  [cephalexin ]  09/23/2018   Albuterol  Palpitations 02/23/2021   Cephalosporins Rash 11/15/2022   Penicillins Rash 12/05/2009    Past Medical History   Past Medical History:  Diagnosis Date   Bilateral lower extremity edema    Bilateral lower extremity edema 04/2013   Cancer (HCC)    vocal cord   Cataract    Hypertension    Thyroid  disease     Past Surgical History   Past Surgical History:  Procedure Laterality Date   ABDOMINAL HYSTERECTOMY  APPENDECTOMY     CATARACT EXTRACTION     CHOLECYSTECTOMY     THROAT SURGERY      Past Family History   Family History  Problem Relation Age of Onset   Heart failure Mother    Cancer Father        Bladder   COPD Brother    Heart disease Sister    COPD Brother     Past Social History   Social History   Socioeconomic History   Marital status: Widowed    Spouse name: Not on file   Number of children: 3   Years of education: Not on file   Highest education level: Not on file  Occupational History   Not on file  Tobacco Use   Smoking status: Former    Current packs/day: 0.00     Types: Cigarettes    Quit date: 02/01/2019    Years since quitting: 4.8   Smokeless tobacco: Never  Vaping Use   Vaping status: Never Used  Substance and Sexual Activity   Alcohol use: Yes    Comment: glass of wine occasionally    Drug use: No   Sexual activity: Not Currently    Birth control/protection: Surgical    Comment: hyst  Other Topics Concern   Not on file  Social History Narrative   Not on file   Social Drivers of Health   Financial Resource Strain: Low Risk  (07/28/2021)   Received from [facility]   Overall Financial Resource Strain (CARDIA)    Difficulty of Paying Living Expenses: Not hard at all  Food Insecurity: No Food Insecurity (07/28/2021)   Received from [facility]   Hunger Vital Sign    Within the past 12 months, you worried that your food would run out before you got the money to buy more.: Never true    Within the past 12 months, the food you bought just didn't last and you didn't have money to get more.: Never true  Transportation Needs: No Transportation Needs (06/30/2021)   Received from [facility]   PRAPARE - Transportation    Lack of Transportation (Medical): No    Lack of Transportation (Non-Medical): No  Physical Activity: Sufficiently Active (04/30/2021)   Received from [facility]   Exercise Vital Sign    On average, how many days per week do you engage in moderate to strenuous exercise (like a brisk walk)?: 6 days    On average, how many minutes do you engage in exercise at this level?: 30 min  Stress: No Stress Concern Present (07/28/2021)   Received from [facility] - Occupational Stress Questionnaire    Feeling of Stress : Not at all  Social Connections: Moderately Integrated (04/30/2021)   Received from [facility]   Social Connection and Isolation Panel    In a typical week, how many times do you talk on the phone with family, friends, or neighbors?: Three times a week     How often do you get together with friends or relatives?: Twice a week    How often do you attend church or religious services?: 1 to 4 times per year    Do you belong to any clubs or organizations such as church groups, unions, fraternal or athletic groups, or school groups?: Yes    How often do you attend meetings of the clubs or organizations you belong to?: 1 to 4 times per  year    Are you married, widowed, divorced, separated, never married, or living with a partner?: Widowed  Intimate Partner Violence: Not At Risk (04/30/2021)   Received from [facility]   Humiliation, Afraid, Rape, and Kick questionnaire    Within the last year, have you been afraid of your partner or ex-partner?: No    Within the last year, have you been humiliated or emotionally abused in other ways by your partner or ex-partner?: No    Within the last year, have you been kicked, hit, slapped, or otherwise physically hurt by your partner or ex-partner?: No    Within the last year, have you been raped or forced to have any kind of sexual activity by your partner or ex-partner?: No    Review of Systems   General: Negative for anorexia, weight loss, fever, chills, fatigue, weakness. Eyes: Negative for vision changes.  ENT: Negative for nasal congestion. See hpi. +chronic hoarseness CV: Negative for chest pain, angina, palpitations, dyspnea on exertion, peripheral edema.  Respiratory: Negative for dyspnea at rest, dyspnea on exertion, cough, sputum, wheezing.  GI: See history of present illness. GU:  Negative for dysuria, hematuria, urinary incontinence, urinary frequency, nocturnal urination.  MS: Negative for joint pain, low back pain.  Derm: Negative for rash or itching.  Neuro: Negative for weakness, abnormal sensation, seizure, frequent headaches, memory loss,  confusion.  Psych: Negative for anxiety, depression, suicidal ideation, hallucinations.  Endo: Negative for unusual weight change.  Heme: Negative  for bruising or bleeding. Allergy: Negative for rash or hives.  Physical Exam   BP (!) 154/67 (BP Location: Right Arm, Patient Position: Sitting, Cuff Size: Normal)   Pulse 65   Temp 98 F (36.7 C) (Oral)   Ht 5' 9 (1.753 m)   Wt 168 lb 12.8 oz (76.6 kg)   SpO2 93%   BMI 24.93 kg/m    General: Well-nourished, well-developed in no acute distress.  Head: Normocephalic, atraumatic.   Eyes: Conjunctiva pink, no icterus. Mouth: Oropharyngeal mucosa moist and pink  Neck: Supple without thyromegaly, masses, or lymphadenopathy.  Lungs: Clear to auscultation bilaterally.  Heart: Regular rate and rhythm, no murmurs rubs or gallops.  Abdomen: Bowel sounds are normal, nontender, nondistended, no hepatosplenomegaly or masses,  no abdominal bruits or hernia, no rebound or guarding.   Rectal: not performed Extremities: No lower extremity edema. No clubbing or deformities.  Neuro: Alert and oriented x 4 , grossly normal neurologically.  Skin: Warm and dry, no rash or jaundice.   Psych: Alert and cooperative, normal mood and affect.  Labs   See above  Imaging Studies   No results found.  Assessment/Plan:   Dysphagia: - with history of Schatzki ring in the past s/p dilation in 2011 -remote history of vocal cord cancer -EGD/ED with Dr. Shaaron. ASA 2.  I have discussed the risks, alternatives, benefits with regards to but not limited to the risk of reaction to medication, bleeding, infection, perforation and the patient is agreeable to proceed. Written consent to be obtained. -avoid hard/dry foods until esophageal dilation       Sonny RAMAN. Ezzard, MHS, PA-C [facility]

## 2023-11-21 ENCOUNTER — Encounter: Payer: Self-pay | Admitting: *Deleted

## 2023-11-21 ENCOUNTER — Ambulatory Visit: Admitting: Gastroenterology

## 2023-11-21 ENCOUNTER — Encounter: Payer: Self-pay | Admitting: Gastroenterology

## 2023-11-21 VITALS — BP 154/67 | HR 65 | Temp 98.0°F | Ht 69.0 in | Wt 168.8 lb

## 2023-11-21 DIAGNOSIS — R1310 Dysphagia, unspecified: Secondary | ICD-10-CM | POA: Diagnosis not present

## 2023-11-21 DIAGNOSIS — R1319 Other dysphagia: Secondary | ICD-10-CM | POA: Insufficient documentation

## 2023-11-21 DIAGNOSIS — Z8521 Personal history of malignant neoplasm of larynx: Secondary | ICD-10-CM

## 2023-11-21 NOTE — Patient Instructions (Signed)
 Upper endoscopy to be scheduled. See separate instructions.   Chew food thoroughly. Avoid tough meats or hard foods until endoscopy is completed.

## 2023-11-24 ENCOUNTER — Encounter (HOSPITAL_COMMUNITY)
Admission: RE | Disposition: A | Discharge status: Home / Self Care | Payer: Self-pay | Source: Home / Self Care | Attending: Internal Medicine

## 2023-11-24 ENCOUNTER — Encounter (HOSPITAL_COMMUNITY): Payer: Self-pay | Admitting: Internal Medicine

## 2023-11-24 ENCOUNTER — Ambulatory Visit (HOSPITAL_COMMUNITY): Admitting: Anesthesiology

## 2023-11-24 ENCOUNTER — Ambulatory Visit (HOSPITAL_COMMUNITY)
Admission: RE | Admit: 2023-11-24 | Discharge: 2023-11-24 | Disposition: A | Discharge status: Home / Self Care | Attending: Internal Medicine | Admitting: Internal Medicine

## 2023-11-24 ENCOUNTER — Ambulatory Visit (HOSPITAL_BASED_OUTPATIENT_CLINIC_OR_DEPARTMENT_OTHER): Admitting: Anesthesiology

## 2023-11-24 ENCOUNTER — Other Ambulatory Visit: Payer: Self-pay

## 2023-11-24 DIAGNOSIS — K449 Diaphragmatic hernia without obstruction or gangrene: Secondary | ICD-10-CM

## 2023-11-24 DIAGNOSIS — I10 Essential (primary) hypertension: Secondary | ICD-10-CM | POA: Diagnosis not present

## 2023-11-24 DIAGNOSIS — J449 Chronic obstructive pulmonary disease, unspecified: Secondary | ICD-10-CM | POA: Insufficient documentation

## 2023-11-24 DIAGNOSIS — Z87891 Personal history of nicotine dependence: Secondary | ICD-10-CM | POA: Diagnosis not present

## 2023-11-24 DIAGNOSIS — Z79890 Hormone replacement therapy (postmenopausal): Secondary | ICD-10-CM | POA: Insufficient documentation

## 2023-11-24 DIAGNOSIS — R1310 Dysphagia, unspecified: Secondary | ICD-10-CM | POA: Diagnosis present

## 2023-11-24 DIAGNOSIS — K3189 Other diseases of stomach and duodenum: Secondary | ICD-10-CM | POA: Diagnosis not present

## 2023-11-24 DIAGNOSIS — K648 Other hemorrhoids: Secondary | ICD-10-CM | POA: Insufficient documentation

## 2023-11-24 DIAGNOSIS — K222 Esophageal obstruction: Secondary | ICD-10-CM

## 2023-11-24 DIAGNOSIS — Z79899 Other long term (current) drug therapy: Secondary | ICD-10-CM | POA: Insufficient documentation

## 2023-11-24 DIAGNOSIS — K2970 Gastritis, unspecified, without bleeding: Secondary | ICD-10-CM | POA: Diagnosis not present

## 2023-11-24 DIAGNOSIS — Z8521 Personal history of malignant neoplasm of larynx: Secondary | ICD-10-CM | POA: Diagnosis not present

## 2023-11-24 HISTORY — PX: ESOPHAGOGASTRODUODENOSCOPY: SHX5428

## 2023-11-24 HISTORY — PX: ESOPHAGEAL DILATION: SHX303

## 2023-11-24 SURGERY — EGD (ESOPHAGOGASTRODUODENOSCOPY)
Anesthesia: General

## 2023-11-24 MED ORDER — LACTATED RINGERS IV SOLN: INTRAVENOUS | Status: DC

## 2023-11-24 MED ORDER — LACTATED RINGERS IV SOLN: INTRAVENOUS | Status: DC | PRN

## 2023-11-24 MED ORDER — LIDOCAINE 2% (20 MG/ML) 5 ML SYRINGE
INTRAMUSCULAR | Status: DC | PRN
  Administered 2023-11-24: 80 mg via INTRAVENOUS

## 2023-11-24 MED ORDER — PROPOFOL 10 MG/ML IV BOLUS
INTRAVENOUS | Status: DC | PRN
  Administered 2023-11-24: 30 mg via INTRAVENOUS
  Administered 2023-11-24: 20 mg via INTRAVENOUS
  Administered 2023-11-24: 30 mg via INTRAVENOUS
  Administered 2023-11-24: 20 mg via INTRAVENOUS
  Administered 2023-11-24: 50 mg via INTRAVENOUS

## 2023-11-24 NOTE — Anesthesia Preprocedure Evaluation (Signed)
 Anesthesia Evaluation  Patient identified by MRN, date of birth, ID band Patient awake    Reviewed: Allergy & Precautions, H&P , NPO status , Patient's Chart, lab work & pertinent test results, reviewed documented beta blocker date and time   Airway Mallampati: II  TM Distance: >3 FB Neck ROM: full    Dental no notable dental hx.    Pulmonary neg pulmonary ROS, COPD, former smoker   Pulmonary exam normal breath sounds clear to auscultation       Cardiovascular Exercise Tolerance: Good hypertension, negative cardio ROS  Rhythm:regular Rate:Normal     Neuro/Psych negative neurological ROS  negative psych ROS   GI/Hepatic negative GI ROS, Neg liver ROS,,,  Endo/Other  negative endocrine ROS    Renal/GU negative Renal ROS  negative genitourinary   Musculoskeletal   Abdominal   Peds  Hematology negative hematology ROS (+)   Anesthesia Other Findings   Reproductive/Obstetrics negative OB ROS                              Anesthesia Physical Anesthesia Plan  ASA: 2  Anesthesia Plan: General   Post-op Pain Management:    Induction:   PONV Risk Score and Plan: Propofol  infusion  Airway Management Planned:   Additional Equipment:   Intra-op Plan:   Post-operative Plan:   Informed Consent: I have reviewed the patients History and Physical, chart, labs and discussed the procedure including the risks, benefits and alternatives for the proposed anesthesia with the patient or authorized representative who has indicated his/her understanding and acceptance.     Dental Advisory Given  Plan Discussed with: CRNA  Anesthesia Plan Comments:         Anesthesia Quick Evaluation

## 2023-11-24 NOTE — Interval H&P Note (Signed)
 History and Physical Interval Note:  11/24/2023 9:42 AM  Linda Williamson  has presented today for surgery, with the diagnosis of dysphagia.  The various methods of treatment have been discussed with the patient and family. After consideration of risks, benefits and other options for treatment, the patient has consented to  Procedure(s) with comments: EGD (ESOPHAGOGASTRODUODENOSCOPY) (N/A) - 9:30 am, asa 2 DILATION, ESOPHAGUS (N/A) as a surgical intervention.  The patient's history has been reviewed, patient examined, no change in status, stable for surgery.  I have reviewed the patient's chart and labs.  Questions were answered to the patient's satisfaction.     Linda Williamson   no change.  EGD today with possible esophageal dilation as feasible/appropriate today per plan.  The risks, benefits, limitations, alternatives and imponderables have been reviewed with the patient. Potential for esophageal dilation, biopsy, etc. have also been reviewed.  Questions have been answered. All parties agreeable.

## 2023-11-24 NOTE — Discharge Instructions (Addendum)
 EGD Discharge instructions Please read the instructions outlined below and refer to this sheet in the next few weeks. These discharge instructions provide you with general information on caring for yourself after you leave the hospital. Your doctor may also give you specific instructions. While your treatment has been planned according to the most current medical practices available, unavoidable complications occasionally occur. If you have any problems or questions after discharge, please call your doctor. ACTIVITY You may resume your regular activity but move at a slower pace for the next 24 hours.  Take frequent rest periods for the next 24 hours.  Walking will help expel (get rid of) the air and reduce the bloated feeling in your abdomen.  No driving for 24 hours (because of the anesthesia (medicine) used during the test).  You may shower.  Do not sign any important legal documents or operate any machinery for 24 hours (because of the anesthesia used during the test).  NUTRITION Drink plenty of fluids.  You may resume your normal diet.  Begin with a light meal and progress to your normal diet.  Avoid alcoholic beverages for 24 hours or as instructed by your caregiver.  MEDICATIONS You may resume your normal medications unless your caregiver tells you otherwise.  WHAT YOU CAN EXPECT TODAY You may experience abdominal discomfort such as a feeling of fullness or "gas" pains.  FOLLOW-UP Your doctor will discuss the results of your test with you.  SEEK IMMEDIATE MEDICAL ATTENTION IF ANY OF THE FOLLOWING OCCUR: Excessive nausea (feeling sick to your stomach) and/or vomiting.  Severe abdominal pain and distention (swelling).  Trouble swallowing.  Temperature over 101 F (37.8 C).  Rectal bleeding or vomiting of blood.        Your esophagus was narrowed (Schatzki's ring).  Your esophagus was stretched today  Your stomach was biopsied.  Further recommendations to follow once biopsy  report returns for review  Office visit with us  in 3 months.  Office will notify you.

## 2023-11-24 NOTE — Op Note (Signed)
 [facility] Patient Name: Linda Williamson Procedure Date: 11/24/2023 9:35 AM MRN: [id number] Date of Birth: [birth year] Attending MD: Lamar Ozell Hollingshead , MD, [id number] CSN: [id number] Age: 89 Admit Type: Outpatient Procedure:                Upper GI endoscopy Indications:              Dysphagia Providers:                Lamar Ozell Hollingshead, MD, Leandrew Edelman RN, RN,                            Rosina Sprague, Dorcas Lenis, Technician Referring MD:              Medicines:                Propofol  per Anesthesia Complications:            No immediate complications. Estimated Blood Loss:     Estimated blood loss was minimal. Procedure:                Pre-Anesthesia Assessment:                           - Prior to the procedure, a History and Physical                            was performed, and patient medications and                            allergies were reviewed. The patient's tolerance of                            previous anesthesia was also reviewed. The risks                            and benefits of the procedure and the sedation                            options and risks were discussed with the patient.                            All questions were answered, and informed consent                            was obtained. Prior Anticoagulants: The patient has                            taken no anticoagulant or antiplatelet agents. ASA                            Grade Assessment: III - A patient with severe                            systemic disease. After reviewing the risks and  benefits, the patient was deemed in satisfactory                            condition to undergo the procedure.                           After obtaining informed consent, the endoscope was                            passed under direct vision. Throughout the                            procedure, the patient's blood pressure, pulse, and                             oxygen  saturations were monitored continuously. The                            [id number]-[id number] ([id number]) scope was introduced through                            the mouth, and advanced to the second part of                            duodenum. The upper GI endoscopy was accomplished                            without difficulty. The patient tolerated the                            procedure well. Scope In: 9:56:09 AM Scope Out: 10:06:36 AM Total Procedure Duration: 0 hours 10 minutes 27 seconds  Findings:      A mild Schatzki ring was found at the gastroesophageal junction.       Esophagus otherwise appeared normal.      A small hiatal hernia was present. Hyperplastic appearing nodularity in       the prepyloric antral mucosa. Please see photos.The scope was withdrawn.       Dilation was performed with a Maloney dilator with mild resistance at 56       Fr. The dilation site was examined following endoscope reinsertion and       showed moderate mucosal disruption. Estimated blood loss was minimal.       Finally, the abnormal antral mucosa was biopsied for histologic study. Impression:               - Mild Schatzki ring. Dilated.                           - Small hiatal hernia. Nodular antral mucosa of                            uncertain significance?"status post biopsy Moderate Sedation:      Moderate (conscious) sedation was administered by the nurse and       supervised by the endoscopist. The following parameters were monitored:  oxygen  saturation, heart rate, blood pressure, and response to care. Recommendation:           - Patient has a contact number available for                            emergencies. The signs and symptoms of potential                            delayed complications were discussed with the                            patient. Return to normal activities tomorrow.                            Written discharge instructions were provided to the                             patient.                           - Advance diet as tolerated. Continue present                            medications. Follow-up on pathology. Office visit                            with us  in 3 months. Procedure Code(s):        --- Professional ---                           [phone], Esophagogastroduodenoscopy, flexible,                            transoral; diagnostic, including collection of                            specimen(s) by brushing or washing, when performed                            (separate procedure)                           43450, Dilation of esophagus, by unguided sound or                            bougie, single or multiple passes Diagnosis Code(s):        --- Professional ---                           K22.2, Esophageal obstruction                           K44.9, Diaphragmatic hernia without obstruction or                            gangrene  R13.10, Dysphagia, unspecified CPT copyright 2022 American Medical Association. All rights reserved. The codes documented in this report are preliminary and upon coder review may  be revised to meet current compliance requirements. [name] Lisbet Busker, MD Lamar Ozell Hollingshead, MD 11/24/2023 10:14:12 AM This report has been signed electronically. Number of Addenda: 0

## 2023-11-24 NOTE — Transfer of Care (Signed)
 Immediate Anesthesia Transfer of Care Note  Patient: Linda Williamson  Procedure(s) Performed: EGD (ESOPHAGOGASTRODUODENOSCOPY) DILATION, ESOPHAGUS  Patient Location: PACU and Short Stay  Anesthesia Type:General  Level of Consciousness: awake, alert , and oriented  Airway & Oxygen  Therapy: Patient Spontanous Breathing  Post-op Assessment: Report given to RN and Post -op Vital signs reviewed and stable  Post vital signs: Reviewed and stable  Last Vitals:  Vitals Value Taken Time  BP 119/55 11/24/23 10:09  Temp 36.6 C 11/24/23 10:09  Pulse 66 11/24/23 10:09  Resp 20 11/24/23 10:09  SpO2 97 % 11/24/23 10:09    Last Pain:  Vitals:   11/24/23 1009  TempSrc: Oral  PainSc: 0-No pain      Patients Stated Pain Goal: 8 (11/24/23 0749)  Complications: No notable events documented.

## 2023-11-25 ENCOUNTER — Encounter (HOSPITAL_COMMUNITY): Payer: Self-pay | Admitting: Internal Medicine

## 2023-11-25 ENCOUNTER — Ambulatory Visit: Payer: Self-pay | Admitting: Internal Medicine

## 2023-11-25 LAB — SURGICAL PATHOLOGY

## 2023-12-02 NOTE — Anesthesia Postprocedure Evaluation (Signed)
 Anesthesia Post Note  Patient: Linda Williamson  Procedure(s) Performed: EGD (ESOPHAGOGASTRODUODENOSCOPY) DILATION, ESOPHAGUS  Patient location during evaluation: Phase II Anesthesia Type: General Level of consciousness: awake Pain management: pain level controlled Vital Signs Assessment: post-procedure vital signs reviewed and stable Respiratory status: spontaneous breathing and respiratory function stable Cardiovascular status: blood pressure returned to baseline and stable Postop Assessment: no headache and no apparent nausea or vomiting Anesthetic complications: no Comments: Late entry   No notable events documented.   Last Vitals:  Vitals:   11/24/23 0749 11/24/23 1009  BP: (!) 153/75 (!) 119/55  Pulse: 69 66  Resp: (!) 22 20  Temp: 36.6 C 36.6 C  SpO2: 95% 97%    Last Pain:  Vitals:   11/24/23 1009  TempSrc: Oral  PainSc: 0-No pain                 [name]

## 2023-12-05 ENCOUNTER — Ambulatory Visit (INDEPENDENT_AMBULATORY_CARE_PROVIDER_SITE_OTHER): Admitting: Adult Health

## 2023-12-05 ENCOUNTER — Encounter: Payer: Self-pay | Admitting: Adult Health

## 2023-12-05 VITALS — BP 138/71 | HR 84 | Ht 68.0 in | Wt 165.0 lb

## 2023-12-05 DIAGNOSIS — N898 Other specified noninflammatory disorders of vagina: Secondary | ICD-10-CM | POA: Insufficient documentation

## 2023-12-05 DIAGNOSIS — T83718A Erosion of other implanted mesh and other prosthetic materials to surrounding organ or tissue, initial encounter: Secondary | ICD-10-CM | POA: Insufficient documentation

## 2023-12-05 DIAGNOSIS — Z90710 Acquired absence of both cervix and uterus: Secondary | ICD-10-CM

## 2023-12-05 DIAGNOSIS — N760 Acute vaginitis: Secondary | ICD-10-CM | POA: Diagnosis not present

## 2023-12-05 DIAGNOSIS — N952 Postmenopausal atrophic vaginitis: Secondary | ICD-10-CM

## 2023-12-05 DIAGNOSIS — T83718D Erosion of other implanted mesh and other prosthetic materials to surrounding organ or tissue, subsequent encounter: Secondary | ICD-10-CM

## 2023-12-05 DIAGNOSIS — B9689 Other specified bacterial agents as the cause of diseases classified elsewhere: Secondary | ICD-10-CM | POA: Insufficient documentation

## 2023-12-05 LAB — POCT WET PREP (WET MOUNT)
Clue Cells Wet Prep Whiff POC: POSITIVE
WBC, Wet Prep HPF POC: POSITIVE

## 2023-12-05 MED ORDER — ESTRADIOL 0.1 MG/GM VA CREA: TOPICAL_CREAM | VAGINAL | 1 refills | Status: AC

## 2023-12-05 MED ORDER — METRONIDAZOLE 0.75 % VA GEL: 1.0000 | Freq: Every day | VAGINAL | 0 refills | Status: AC

## 2023-12-05 NOTE — Progress Notes (Signed)
  Subjective:     Patient ID: Linda Williamson, female   DOB: 1934/10/18, 89 y.o.   MRN: [id number]  HPI Linda Williamson is a 89 year old white female, widowed, sp hysterectomy in complaining of vaginal odor.  PCP is JONETTA Batty NP  Review of Systems +vaginal odor Reviewed past medical,surgical, social and family history. Reviewed medications and allergies.     Objective:   Physical Exam BP 138/71 (BP Location: Left Arm, Patient Position: Sitting, Cuff Size: Normal)   Pulse 84   Ht 5' 8 (1.727 m)   Wt 165 lb (74.8 kg)   BMI 25.09 kg/m     Skin warm and dry.Pelvic: external genitalia is normal in appearance no lesions, vagina: pale, +odor, can feel mesh anterior vagina,urethra has no lesions or masses noted, cervix and uterus are absent, adnexa: no masses or tenderness noted. Bladder is non tender and no masses felt. Wet prep: + for clue cells and +WBCs.  Upstream - 12/05/23 1145       Pregnancy Intention Screening   Does the patient want to become pregnant in the next year? N/A    Does the patient's partner want to become pregnant in the next year? N/A    Would the patient like to discuss contraceptive options today? N/A      Contraception Wrap Up   Current Method Female Sterilization   hyst   End Method Female Sterilization   hyst   Contraception Counseling Provided No         Examination chaperoned by Clarita Salt LPN  Assessment:     1. Vaginal odor (Primary) + odor +clue cells, will rx metrogel  Meds ordered this encounter  Medications   estradiol  (ESTRACE  VAGINAL) 0.1 MG/GM vaginal cream    Sig: Use 1 gm in vagina 2 x weekly    Dispense:  42.5 g    Refill:  1    Supervising Provider:   JAYNE MINDER H [[id number]]   metroNIDAZOLE (METROGEL) 0.75 % vaginal gel    Sig: Place 1 Applicatorful vaginally at bedtime.    Dispense:  70 g    Refill:  0    Supervising Provider:   JAYNE MINDER H [[id number]]    - POCT Wet Prep [facility])  2. S/P hysterectomy  3. Erosion of bladder  suspension mesh, subsequent encounter Can feel mesh, use estrace  2 x weekly can use pea sized amount on her finger  4. BV (bacterial vaginosis) Rx metrogel  - POCT Wet Prep [employer] [facility])  5. Vaginal atrophy Use estrace  cream 2 x weekly     Plan:     Follow up prn

## 2023-12-05 NOTE — Patient Instructions (Signed)
 Follow up prn

## 2024-01-25 ENCOUNTER — Encounter: Payer: Self-pay | Admitting: Internal Medicine

## 2024-03-21 ENCOUNTER — Encounter: Payer: Self-pay | Admitting: Gastroenterology

## 2024-03-21 ENCOUNTER — Ambulatory Visit: Admitting: Gastroenterology

## 2024-03-21 VITALS — BP 155/69 | HR 76 | Temp 98.0°F | Ht 69.0 in | Wt 167.2 lb

## 2024-03-21 DIAGNOSIS — K219 Gastro-esophageal reflux disease without esophagitis: Secondary | ICD-10-CM

## 2024-03-21 DIAGNOSIS — R1310 Dysphagia, unspecified: Secondary | ICD-10-CM | POA: Diagnosis not present

## 2024-03-21 DIAGNOSIS — R1319 Other dysphagia: Secondary | ICD-10-CM

## 2024-03-21 DIAGNOSIS — L989 Disorder of the skin and subcutaneous tissue, unspecified: Secondary | ICD-10-CM | POA: Diagnosis not present

## 2024-03-21 NOTE — Progress Notes (Signed)
 "    GI Office Note    Referring Provider: Hyacinth Honey, NP Primary Care Physician:  Hyacinth Honey, NP  Primary Gastroenterologist: Ozell Hollingshead, MD   Chief Complaint   Chief Complaint  Patient presents with   Follow-up    No GI issues to discuss.    History of Present Illness   Linda Williamson is a 89 y.o. female presenting today for follow up.   Patient was last seen September 2025 for dysphagia.  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  History of Schatzki ring dilated in 2011.  Discussed the use of AI scribe software for clinical note transcription with the patient, who gave verbal consent to proceed.  History of Present Illness   She reports marked improvement in swallowing since dilation, with food passing more easily and no current dysphagia. She continues pantoprazole  for acid reflux. She has daily, well-formed bowel movements without recent changes. No blood in the stool or melena. She is concerned that two nights ago she caused a spot on her back to bleed heavily and she wants me to look at it.      Prior Data     Results     EGD September 2025: - Mild Schatzki ring status post dilation - Small hiatal hernia.  Nodular antral mucosa of uncertain significance status post biopsy, reactive gastropathy no H. pylori.   EGD 2011: -schatzki ring s/p dilation/erosive reflux esophagitis. -gastritis no h.pylori   Colonoscopy 2006: -small AVM, nonbleeding, cecum -normal TI -internal hemorrhoids   Medications   Current Outpatient Medications  Medication Sig Dispense Refill   Acetaminophen  (TYLENOL  PO) Take by mouth.     ALPRAZolam  (XANAX ) 0.5 MG tablet Take 0.5 mg by mouth at bedtime.     amLODipine  (NORVASC ) 2.5 MG tablet Take 2.5 mg by mouth daily.     cyanocobalamin (VITAMIN B12) 500 MCG tablet Take 500 mcg by mouth daily.     estradiol  (ESTRACE  VAGINAL) 0.1 MG/GM vaginal cream Use 1 gm in vagina 2 x weekly 42.5 g 1   levothyroxine   (SYNTHROID ) 50 MCG tablet Take 50 mcg by mouth daily before breakfast.     metroNIDAZOLE  (METROGEL ) 0.75 % vaginal gel Place 1 Applicatorful vaginally at bedtime. 70 g 0   pantoprazole  (PROTONIX ) 20 MG tablet Take 20 mg by mouth daily.     traMADol (ULTRAM) 50 MG tablet Take 50 mg by mouth every 12 (twelve) hours.     VITAMIN D, CHOLECALCIFEROL, PO Take by mouth.     Zinc Sulfate (ZINC 15 PO) Take by mouth.     No current facility-administered medications for this visit.    Allergies   Allergies as of 03/21/2024 - Review Complete 03/21/2024  Allergen Reaction Noted   Nitrofurantoin Hives 02/23/2021   Keflex  [cephalexin ]  09/23/2018   Albuterol  Palpitations 02/23/2021   Cephalosporins Rash 11/15/2022   Penicillins Rash 12/05/2009     Review of Systems   General: Negative for anorexia, weight loss, fever, chills, fatigue, weakness. ENT: Negative for hoarseness, difficulty swallowing , nasal congestion. CV: Negative for chest pain, angina, palpitations, dyspnea on exertion, peripheral edema.  Respiratory: Negative for dyspnea at rest, dyspnea on exertion, cough, sputum, wheezing.  GI: See history of present illness. GU:  Negative for dysuria, hematuria, urinary incontinence, urinary frequency, nocturnal urination.  Endo: Negative for unusual weight change.     Physical Exam   BP (!) 170/67   Pulse 85   Temp 98 F (36.7 C) (Oral)  Ht 5' 9 (1.753 m)   Wt 167 lb 3.2 oz (75.8 kg)   SpO2 94%   BMI 24.69 kg/m    General: Well-nourished, well-developed in no acute distress.  Eyes: No icterus. Mouth: Oropharyngeal mucosa moist and pink   Back: two tiny scabs, one over lying superficial blood vessel, some skin damaged areas.  Extremities: No lower extremity edema. No clubbing or deformities. Neuro: Alert and oriented x 4   Skin: Warm and dry, no jaundice.   Psych: Alert and cooperative, normal mood and affect.  Labs   No new labs Imaging Studies   No results  found.  Assessment/Plan:    Assessment & Plan Dysphagia, esophageal -Recent EGD with Schatzki ring s/p dilation -dysphagia resolved   Gastroesophageal reflux disease (GERD) GERD well-managed with pantoprazole , no symptoms reported. - Continue pantoprazole  20mg  daily before breakfast.  Back skin lesion -evaluated briefly today to rule out shingles, cellulitis, active bleeding. Two small spots with tiny scabs, likely skin damage related. Advised to discuss with PCP or dermatology.      Linda Williamson, MHS, PA-C [facility]  "

## 2024-03-21 NOTE — Patient Instructions (Signed)
 Continue pantoprazole  once daily before breakfast.   Speak with your PCP or dermatologist regarding back lesions.   Return to our office if you have any recurrent stomach issues or problems with swallowing.
